# Patient Record
Sex: Male | Born: 1935 | Race: White | Hispanic: No | Marital: Married | Smoking: Former smoker
Health system: Southern US, Community
[De-identification: ages and names within clinical notes are randomized; demographics above are authoritative.]

## PROBLEM LIST (undated history)

## (undated) DIAGNOSIS — M109 Gout, unspecified: Secondary | ICD-10-CM

---

## 1998-11-07 ENCOUNTER — Encounter: Payer: Self-pay | Admitting: Cardiology

## 1998-11-07 ENCOUNTER — Encounter: Payer: Self-pay | Admitting: Emergency Medicine

## 1998-11-07 ENCOUNTER — Inpatient Hospital Stay (HOSPITAL_COMMUNITY): Admission: EM | Admit: 1998-11-07 | Discharge: 1998-11-09 | Payer: Self-pay | Admitting: Emergency Medicine

## 1998-11-08 ENCOUNTER — Encounter: Payer: Self-pay | Admitting: Cardiology

## 1998-11-09 ENCOUNTER — Encounter: Payer: Self-pay | Admitting: Cardiology

## 1999-08-16 ENCOUNTER — Inpatient Hospital Stay (HOSPITAL_COMMUNITY): Admission: AD | Admit: 1999-08-16 | Discharge: 1999-08-19 | Payer: Self-pay | Admitting: Ophthalmology

## 1999-09-01 ENCOUNTER — Ambulatory Visit (HOSPITAL_COMMUNITY): Admission: RE | Admit: 1999-09-01 | Discharge: 1999-09-02 | Payer: Self-pay | Admitting: Ophthalmology

## 1999-09-15 ENCOUNTER — Encounter: Payer: Self-pay | Admitting: Ophthalmology

## 1999-09-15 ENCOUNTER — Observation Stay (HOSPITAL_COMMUNITY): Admission: RE | Admit: 1999-09-15 | Discharge: 1999-09-16 | Payer: Self-pay | Admitting: Ophthalmology

## 2003-12-19 ENCOUNTER — Emergency Department (HOSPITAL_COMMUNITY): Admission: EM | Admit: 2003-12-19 | Discharge: 2003-12-20 | Payer: Self-pay | Admitting: Emergency Medicine

## 2004-01-07 ENCOUNTER — Ambulatory Visit (HOSPITAL_BASED_OUTPATIENT_CLINIC_OR_DEPARTMENT_OTHER): Admission: RE | Admit: 2004-01-07 | Discharge: 2004-01-07 | Payer: Self-pay | Admitting: Orthopedic Surgery

## 2004-10-29 ENCOUNTER — Ambulatory Visit (HOSPITAL_BASED_OUTPATIENT_CLINIC_OR_DEPARTMENT_OTHER): Admission: RE | Admit: 2004-10-29 | Discharge: 2004-10-29 | Payer: Self-pay | Admitting: Orthopedic Surgery

## 2004-10-29 ENCOUNTER — Ambulatory Visit (HOSPITAL_COMMUNITY): Admission: RE | Admit: 2004-10-29 | Discharge: 2004-10-29 | Payer: Self-pay | Admitting: Orthopedic Surgery

## 2005-01-12 IMAGING — CT CT HEAD W/O CM
1 of 2 series · 13 of 30 positions shown, 17 images · non-contrast
Comparison: none

CLINICAL DATA: Headaches.  
 CT HEAD WITHOUT CONTRAST 
 Routine imaging was performed through the brain without IV contrast. 
 There is mild atrophy.  No acute intracranial abnormality.  Specifically, no hemorrhage, hydrocephalus, tumor, vascular lesion, acute infarction, or significant intracranial injury.  Visualized calvarium and paranasal sinuses unremarkable.  Post-operative changes noted in the left globe. 
 IMPRESSION
 No acute intracranial abnormality.  Mild atrophy.

[Series 2: brain · axial · 0.47mm/px · z∈[+146,+266]mm · 13 of 28 slices shown, 17 images]
[im 2/28  brain]
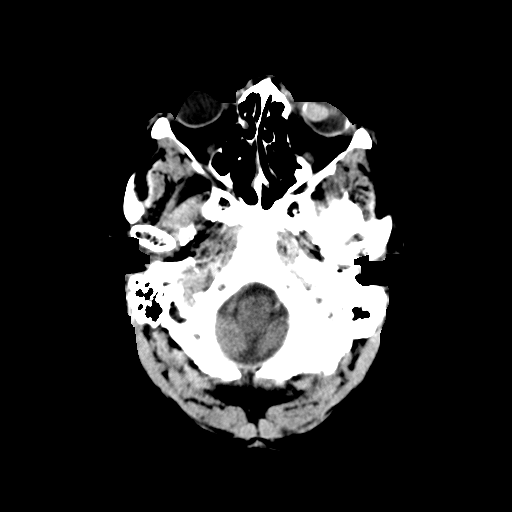
[im 2/28  bone]
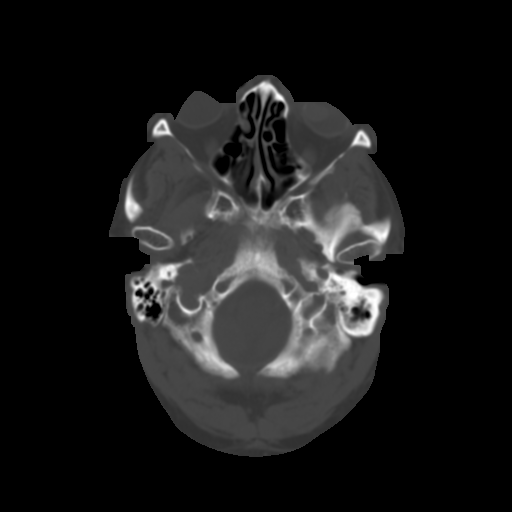
[im 4/28  brain]
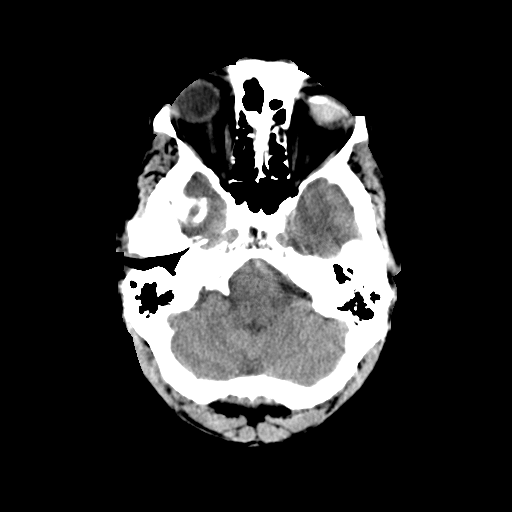
[im 6/28  brain]
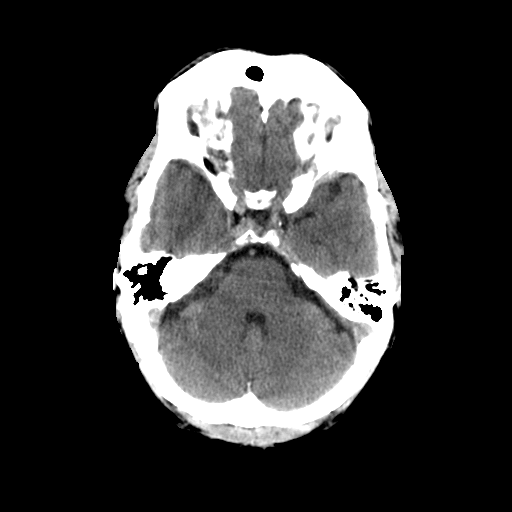
[im 8/28  brain]
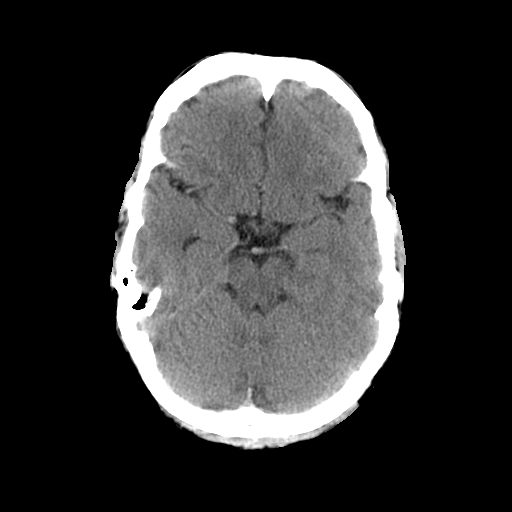
[im 10/28  brain]
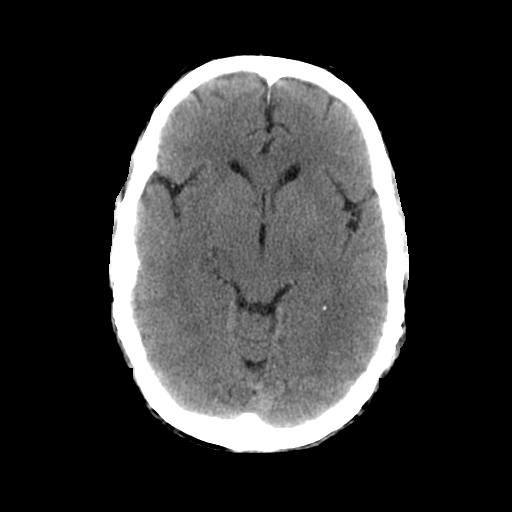
[im 10/28  bone]
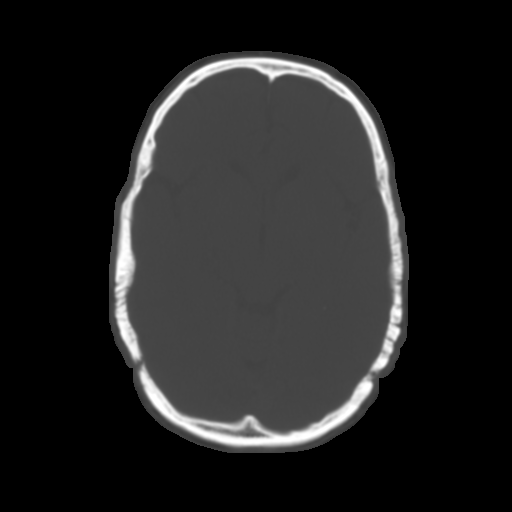
[im 12/28  brain]
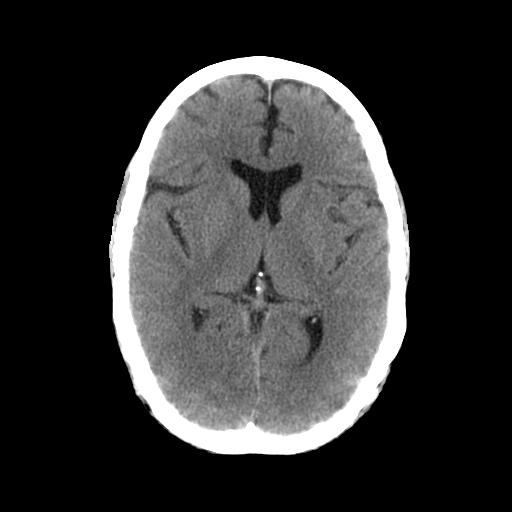
[im 14/28  brain]
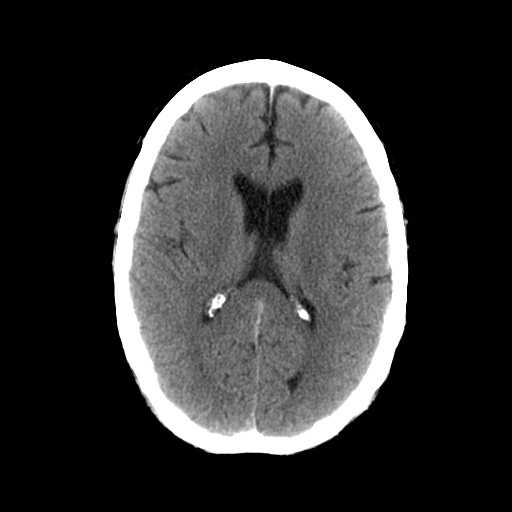
[im 16/28  brain]
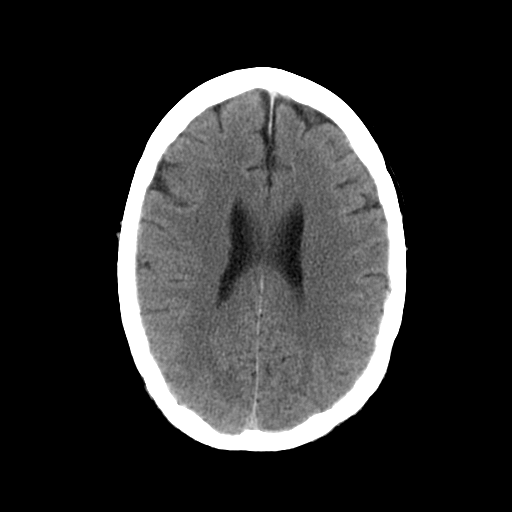
[im 18/28  brain]
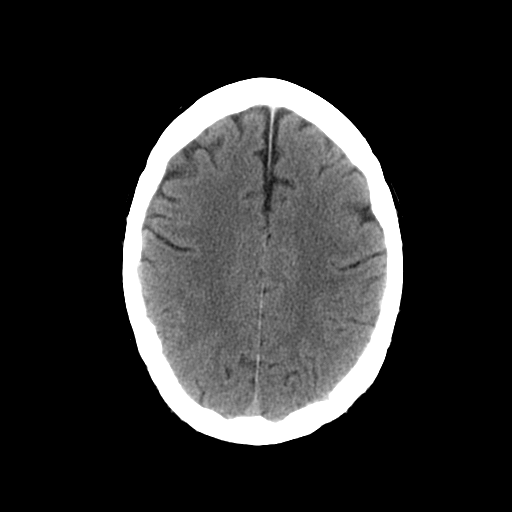
[im 18/28  bone]
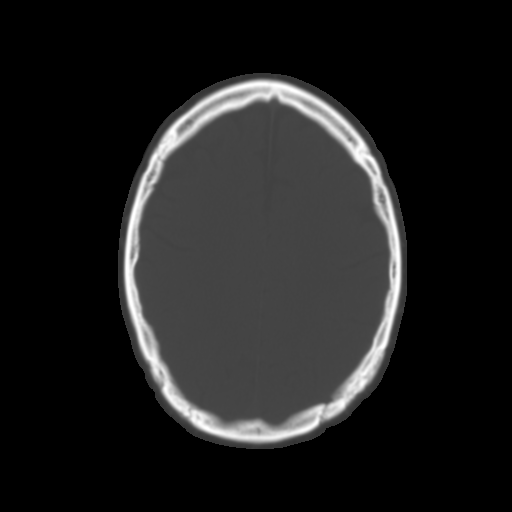
[im 20/28  brain]
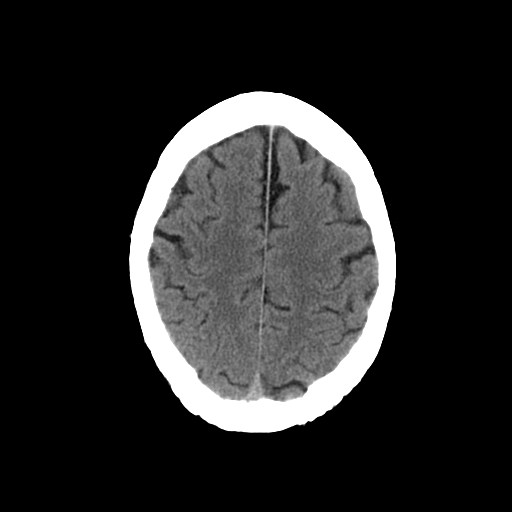
[im 22/28  brain]
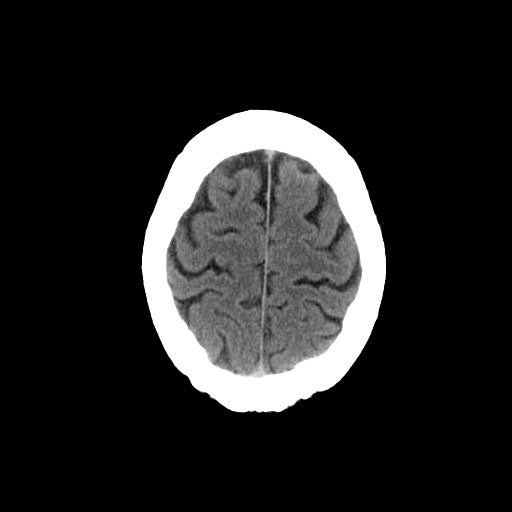
[im 24/28  brain]
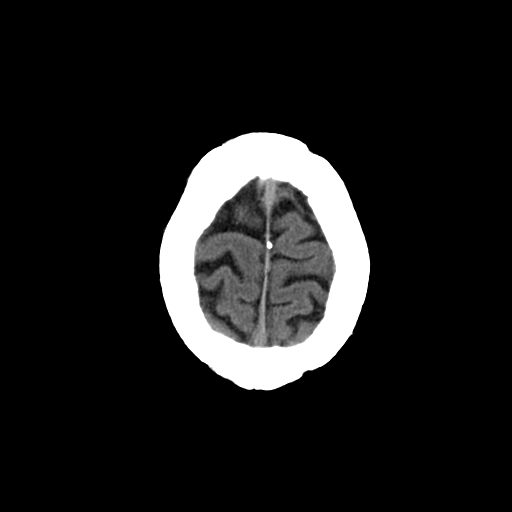
[im 26/28  brain]
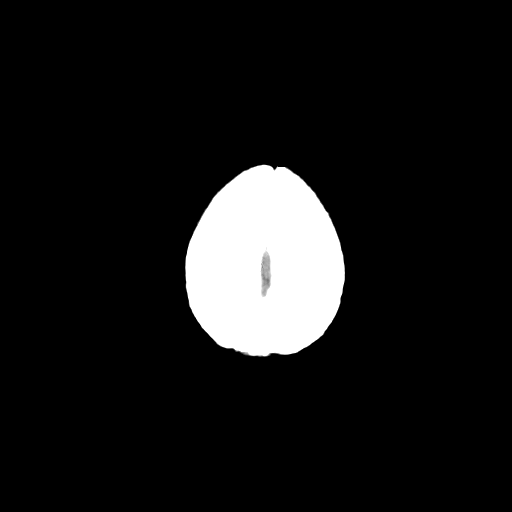
[im 26/28  bone]
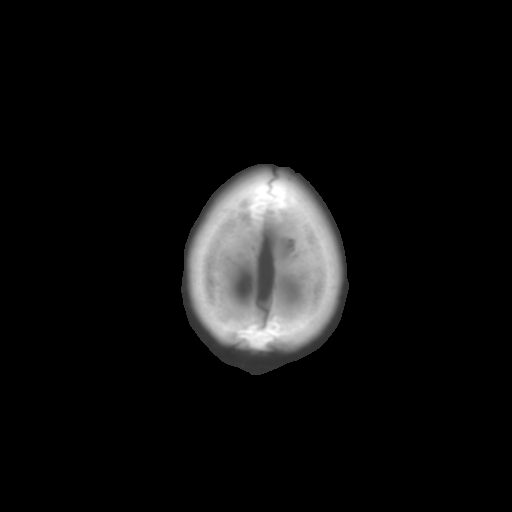

[13 of 30 positions shown; findings below may reference images not displayed]

## 2005-01-19 ENCOUNTER — Ambulatory Visit (HOSPITAL_COMMUNITY): Admission: RE | Admit: 2005-01-19 | Discharge: 2005-01-19 | Payer: Self-pay | Admitting: *Deleted

## 2005-01-19 ENCOUNTER — Ambulatory Visit (HOSPITAL_BASED_OUTPATIENT_CLINIC_OR_DEPARTMENT_OTHER): Admission: RE | Admit: 2005-01-19 | Discharge: 2005-01-19 | Payer: Self-pay | Admitting: *Deleted

## 2008-07-30 ENCOUNTER — Inpatient Hospital Stay (HOSPITAL_COMMUNITY): Admission: RE | Admit: 2008-07-30 | Discharge: 2008-08-03 | Payer: Self-pay | Admitting: Orthopedic Surgery

## 2010-12-30 NOTE — Op Note (Signed)
[name], [name]                ACCOUNT NO.:  [id number]   MEDICAL RECORD NO.:  [id number]          PATIENT TYPE:  INP   LOCATION:  2899                         FACILITY:  [facility]   PHYSICIAN:  Feliberto Gottron. Turner Daniels, M.D.   DATE OF BIRTH:  04-Aug-1936   DATE OF PROCEDURE:  07/30/2008  DATE OF DISCHARGE:                               OPERATIVE REPORT   PREOPERATIVE DIAGNOSIS:  End-stage arthritis, left knee.   POSTOPERATIVE DIAGNOSIS:  End-stage arthritis, left knee.   PROCEDURE:  Left total knee arthroplasty using DePuy Sigma RP components  4 femur, 5 tibia, 10 PS spacer, 38-mm patellar button, double batch of  DePuy HV Cement with 1500 mg Zinacef.  All components cemented.   SURGEON:  Feliberto Gottron. Turner Daniels, MD   FIRST ASSISTANT:  Shirl Harris, PA-C   ANESTHETIC:  General endotracheal.   ESTIMATED BLOOD LOSS:  Minimal.   FLUID REPLACEMENT:  1500 mL of crystalloid.   TOURNIQUET TIME:  1 hour and 30 minutes.   DRAINS:  Two medium Hemovacs and a Foley catheter.   URINE OUTPUT:  300 mL.   INDICATIONS FOR PROCEDURE:  A 75 year old man with end-stage arthritis  of his left knee.  Radiographs show moderate arthritis; however, when he  underwent arthroscopy recently had bare bone arthritic changes to the  trochlea and the patellofemoral joint.  He has failed conservative  treatment with anti-inflammatory medicines, physical therapy, judicious  use of narcotics, cortisone injections, and still has severe disabling  pain.  He desires elective left total knee arthroplasty.  Risks and  benefits of surgery discussed, questions answered.   DESCRIPTION OF PROCEDURE:  The patient identified by armband and  underwent left femoral nerve block anesthetic in the block area at [facility] taken to operating room, where the appropriate anesthetic  monitors were attached and general endotracheal anesthesia induced with  the patient in the supine position.  Foley catheter was inserted and  received 2 g of Ancef IV preoperatively.  Lateral post and foot  positioner applied to the table and the left lower extremity had a  tourniquet applied high to the thigh, then prepped and draped in the  usual sterile fashion from the ankle to the tourniquet.  Standard time-  out procedure performed.  Limb wrapped with an Esmarch bandage,  tourniquet inflated to 350 mmHg, and we began the procedure itself by  making an anterior midline incision about 20 cm in length centered over  the patella starting 1 handbreadth above the patella going over the  patella, 1 cm medial to, and 3 cm distal to the tibial tubercle.  Small  bleeders in the skin and subcutaneous tissue were identified and  cauterized.  Transverse retinaculum incised allowing medial parapatellar  arthrotomy.  The patella was everted.  Prepatellar fat pad resected.  Superficial medial collateral ligament elevated off the proximal tibia  from anterior to posterior leaving it intact distally about 6 cm down  the tibial metaphysis.  The knee was then hyperflexed allowing removal  of the anterior half of the menisci.  We actually got the lateral  meniscus out at this point as well.  Cruciate ligaments were resected.  Posteromedial Z retractor was placed, a Michaela retractor through the  notch, and lateral Homan retractor.  With the knee hyperflexed, this  exposed proximal tibia, which was then entered with a DePuy step drill  followed by intramedullary rod and a 2 degrees posterior slope cutting  guide allowing resection of about 8 to 9 mm of bone medially and  laterally using a power oscillating saw.  We then entered the distal  femur 2 mm anterior to the PCL origin followed by the intramedullary rod  and a 5-degree left distal femoral cutting guide set at 11 mm, pinned  along the epicondylar axis, distal femoral cut accomplished, we sized  for #4 femoral component, pinned the cutting guide in 3 degrees of  external rotation and  performed an anterior-posterior chamfer cuts  without difficulty followed by the Sigma RP box cut.  The patella cell  was measured at 24 mm.  We thought a 38 button would be the best fit,  set the cutting guide at 15, and removed the posterior 9-10 mm of the  patella, sized for 38-mm button and drilled.  The knee was then  hyperflexed once again exposing the proximal tibia, which was sized for  #5 tibial baseplate, which was pinned into place followed by the  smokestack and the conical reamer.  The Delta fin keel punch was then  inserted, a 5 left trial femoral component was then hammered on the  femur and the lugs drilled.  We then placed a 10-mm trial spacer and  took the knee through a range of motion from full extension to flexion  of 140 degrees with good tracking of the patella and no ligamentous  laxity.  The trial components were removed and all bony surfaces were  water picked, clean, and dried with suction and sponges, we also removed  any remnants of the menisci posteriorly at this point.  A double batch  of the DePuy HV Cement with 1500 mg of Zinacef was then mixed and  applied to all bony metallic mating surfaces except for the posterior  condyles of the femur itself.  With the knee hyperflexed, we then  hammered into place a #5 tibial baseplate and removed excess cement.  The 4 left femoral component and removed excess cement, inserted a 10-mm  Sigma RP spacer, brought the knee up to full extension, and applied  compression while the cement cured.  The patellar button was then  squeezed into place and held with a clamp and excess cement also  removed.  The wound was then water picked and cleaned one more time.  Medium Hemovacs were inserted from a lateral approach and after the  cement had cured, we then clamped the patella, took the knee to one more  range of motion to confirm good tracking and also to make sure there  were no bits of cement left over.  Parapatellar  arthrotomy was then  closed with running #1 Vicryl suture.  The subcutaneous tissue with 0  and 2-0 undyed Vicryl suture and the skin with skin staples.  A dressing  of Xeroform, 4 x 4 dressing, sponges, Webril, and Ace wrap applied.  Tourniquet let down.  The patient awakened and taken to the recovery  room without difficulty.      Feliberto Gottron. Turner Daniels, M.D.  Electronically Signed     FJR/MEDQ  D:  07/30/2008  T:  07/30/2008  Job:  [id number]

## 2011-01-02 NOTE — H&P (Signed)
[place]. [facility]  Patient:    [name]                          MRN: [id number] Adm. Date:  [id number] Attending:  Ernesto Rutherford CC:         [name], M.D., Ophthalmology, [place], [place]             Ernesto Rutherford, M.D.                         History and Physical  HISTORY:  The patient is a 75 year old man who is two days status post cataract  extraction, with intraocular lens placed in the left eye in a noncomplicated fashion.  The first postoperative day with Dr. Betsy Pries noted mild anterior chamber reaction.  The patient developed, last night, some 36 hours after the procedure, severe pain.  Found today to have profound vision loss in the left eye, with hypopyon and fibrin clot over the pupil of the left eye with lack of perception and vision in the left eye.  The patient is believed to have endophthalmitis and referred for consultation, evaluation, and institution of therapy for the left ye.  On examination, the patient is found to have 1 mm hypopyon with fibrin clot preventing visualization of the posterior pole of the left eye, 2+ chemosis and  significant aching pain is present in the left eye.  PAST OCULAR HISTORY:  Noncontributory with the exception of mild cataract in the right eye.  Past surgery history for the eye is noncontributory.  PAST SURGICAL HISTORY:  Notable for partial gastrectomy for gastric ulcers, as ell as appendectomy.  ALLERGIES:  None.  CURRENT MEDICATIONS:  None.  SOCIAL HISTORY:  The patient is employed full time for [employer].  Does not smoke, and does not drink alcohol.  PHYSICAL EXAMINATION:  VITAL SIGNS:  As per nurses intake.  CHEST:  Clear to auscultation.  CARDIOVASCULAR:  Regular rate and rhythm without murmur.  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  PLAN:  Admission  to the hospital, diagnostic vitreous tap, vitrectomy combined ith installation of intravitreous antibiotics and attempt to salvage the globe. The risks and benefits of the surgery, as well as doing nothing were described at length with the patient and his family (sons).  The patient understood these risks and wishes to proceed with surgical intervention. DD:  08/15/99 TD:  08/16/99 Job: [id number]

## 2011-01-02 NOTE — Op Note (Signed)
[place]. [facility]  Patient:    [name]                          MRN: [id number] Proc. Date: 08/17/99 Adm. Date:  [id number] Attending:  Ernesto Rutherford CC:         [name], M.D., [place]                           Operative Report  PREOPERATIVE DIAGNOSIS:  History of endophthalmitis and treatment of this condition with vitrectomy and insulation of intravitreal antibiotics on August 15, 1999. The patient had a quiet postoperative day #1 with moderate pain, but improvement of his intraocular inclination and on day #2, I noted recurrent hypopion with concern for persistence of Streptococcal pneumoniae infection in the vitreous and anterior chamber of the left eye.  This is therefore a diagnostic vitreous tap and surgical intervention to deliver definitive intraocular antibiotics for the second time o the left eye to hopefully clear the infection.  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  PROCEDURE:  Diagnostic vitreous tap of the left eye.  Instillation of intravitreal antibiotics.  #1 - Vancomycin 1 mg/0.1 cc.  #2 - Ancef or Cefazolin 2.25 mg/0.1 cc volume 0.1 cc into the vitreous.  SURGEON:  Ernesto Rutherford, M.D.  ANESTHESIA:  Local retrobulbar with monitored anesthesia control.  INDICATIONS:  As described above.  DESCRIPTION OF PROCEDURE:  The patient understood the risks of treatment versus no treatment and wishes to proceed with intervention.  He understands that the condition could continue despite our interventions.  After appropriate signed consent was obtained, the patient was taken to the operating room.  In the operating room, appropriate monitoring followed by mild sedation with 0.5% Marcaine 5 cc injected retrobulbar followed by additional 5 cc laterally in fashion modified Darel Hong.  Left  periocular region was then sterilely prepped and draped in the usual ophthalmic fashion with Betadine topically applied.  This was washed from the conjunctival surface a few minutes later.  At this time, a diagnostic vitreous tap was performed using a 38 needle via the  pars plana.  Approximately 0.5 cc was aspirated of a clear color, but nonetheless cloudy fluid with apparent white vessel debris removed.  This was plated directly onto to blood agar plates, chocolatey agar.  Additional anterior chamber tap was then performed to remove anterior chamber debris.  Approximately 0.1 cc removed in this fashion.  Thereafter Vancomycin volume described above was then injected via the pars plana as well as followed by the Cefazolin into the pars plana for a volume also of 0.1 cc additionally. The remainder of these two medications were then injected subconjunctively. Intraocular pressure was assessed and found to be adequate.  The anterior chamber specimen was also plated directly in the operating room to sheet band chocolatey agar.  A sterile patch and Fox shield applied.  The patient tolerated the procedure very well without discomfort or complications.  The patient was then taken to the recovery room in good and stable condition.  Of note, the patient will be continued on high doses of IV Ancef to deliver effective antibiotic killing to the blood ocular barrier.  This will be followed in 8 to 10 hours by the delivery of Decadron to decrease the inflammatory response. DD:  08/18/99 TD:  08/18/99 Job: [id number]

## 2011-01-02 NOTE — Op Note (Signed)
[place]. [facility]  Patient:    [name]                          MRN: [id number] Proc. Date: 09/15/99 Adm. Date:  [id number] Disc. Date: [id number] Attending:  Ernesto Rutherford                           Operative Report  PREOPERATIVE DIAGNOSES: 1. 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 2. 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 3. Dense recurrent vitreitis, left eye. 4. Corneal edema and corneal opacification.  POSTOPERATIVE DIAGNOSES: 1. 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 2. 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 3. Dense recurrent vitreitis, left eye. 4. Corneal edema and corneal opacification. 5. Corneal clearing with anterior chamber flattening found at the time of surgery.  PROCEDURE: 1. Posterior vitrectomy with revision of retinal detachment repair by placement of    scleral buckle, injection of vitreous substitute -- silicone oil -- 5000    centistokes, endodiathermy for retinopathy and inferior peripheral iridectomy    and removal of synclitic membrane in the left eye. 2. Extensive membrane peel formation for recurrent membranes over the retinal    surface. 3. Injection of temporary tissue plasminogen activator to remove and to release    some of the dense fibrin membranes found over the retina. 4. Planned temporary keratoprosthesis with penetrating keratoplasty, which was ot    necessary because the cornea, in fact, was clear at the time of surgery.  SURGEON:  Ernesto Rutherford, M.D.  ANESTHESIA:  General endotracheal anesthesia.  INDICATION FOR PROCEDURE:  Patient is a rather unfortunate man, a 75 year old, ho has had endophthalmitis in the left eye, treated previously with a vitrectomy, injection of intraocular antibiotics on two separate occasions, followed by  one  week later, another injection of antibiotics, with vitrectomy and removal of recurrent vitreous and anterior chamber membranes with removal of intraocular lens because of possible sequestration of the endophthalmitis in the capsular bag. e has now developed a recurrent fibrin formation, recurrent retinal detachment and this is an attempt to reattach the retina to provide salvaging of the globe. Patient had been consented and had given informed consent for consideration of penetrating keratoplasty with temporary keratoprosthesis to allow repair of the  retinal detachment, since the cornea had been cloudy in the previous days leading up to surgery; however, at the time of surgery, under the microscope, it was found that while the anterior chamber was shallow, nonetheless the cornea had cleared and no temporary keratoprosthesis or penetrating keratoplasty would be necessary.  Patient has had profound and persistent vision loss after therapy for endophthalmitis. DD:  09/17/99 TD:  09/17/99 Job: [id number]

## 2011-01-02 NOTE — Op Note (Signed)
[place]. [facility]  Patient:    [name]                          MRN: [id number] Proc. Date: 08/15/99 Adm. Date:  [id number] Attending:  Ernesto Rutherford CC:         [name], M.D. - [facility], [place], [place]                           Operative Report  PREOPERATIVE DIAGNOSIS: 1. Endophthalmitis suspected, left eye. 2. Dense vitritis, uveitis, left eye. 3. Dense pupillary fibrin membrane, left eye. 4. Limbal wound dehiscence of the left eye.  POSTOPERATIVE DIAGNOSIS: 1. Endophthalmitis suspected, left eye. 2. Dense vitritis, euvitis, left eye. 3. Dense pupillary fibrin membrane, left eye. 4. Wound dehiscence of the left eye.  PROCEDURE: 1. Posterior vitrectomy with membrane peel, left eye. 2. Surgical posterior capsulotomy, left eye. 3. Removal of pupillary fibrin membrane with forceps, left eye. 4. Installation of antibiotics - ceftazidime 2.25 mg/0.1 cc volume. 5. Installation of vancomycin 1 mg/0.1 cc volume. 6. Repair of wound dehiscence - limbal.  INDICATIONS:  The patient is a 75 year old man, two days status post cataract extraction with intraocular lens placement in the left eye, who has had profound vision loss of the left eye and pain developing over the last 12 hours, after a  recent cataract extraction with an intraocular lens placement in the left eye, reportedly uncomplicated.  The patient noted the onset of pain last night, and as found today to have dense hypopyon, pupillary fibrin membrane, by Dr. [name], and was sent for a consultation and evaluation, and the institution of therapy, to the hospital emergency room tonight.  The patient was found to have  dense pupillary fibrin membrane, light perception, and vision and hypopyon suspected, highly suspicious of an endophthalmitis.  This is an attempt at diagnostic vitreous tap, as well as therapeutic vitrectomy and debulking of the  vitreous  opacification and infected material.  DESCRIPTION OF PROCEDURE:  The patient had the risks and benefits of the surgical intervention described, with his sons present.  He understands the need for surgical intervention.  He gives his consent for anesthesia as well as surgical  repair, understanding the risks, including hemorrhage, infection, scarring, need for further surgery, no change in vision, loss of vision, and progressive disease, despite intervention.  An appropriate signed consent was obtained.  The patient was taken to the operating room.  In the operating room general endotracheal anesthesia was instituted without difficulty.  The left periocular region was sterilely prepped and draped in the  usual ophthalmic fashion.  A lid speculum was applied.  The conjunctival peritomy was fashioned superiorly.  The decision was made to use a Lewicky anterior chamber maintainer through an inferotemporal limbal wound, to keep the anterior chamber  segment deep and formed and clear.  A second paracentesis wound was then fashioned superotemporally and this allowed for passage of intraocular forceps to the pupillary fibrin membrane, which was rather dense and adherent to the iris pigmented epithelium.  Nonetheless, the pigment and the pupillary fibrin membrane could be removed in this fashion, which allowed for excellent excursion of the pupillary fibers and further dilatation was possible.  The dilation occurred pharmocologically from the previous dilating drops, and was immediate.  At this time limbal incisions were made 3.5 mm posterior limbus superiorly. This was all done with the  Biom microscope.  The Biom attachments were then swung into position.  A core vitrectomy was then begun with the vitrectomy cessation immediately behind the posterior capsule.  The posterior capsule was surgically  opened, to allow for free passage of the anterior chamber fluid into  the posterior chamber.  The anterior vitreous skirt was then engaged, and dense opacified white loculated cottage cheese-type inflammatory material was identified and removed.  The core vitrectomy was then completed.  The posterior vitreous was attached to the optic nerve; however, was mobile, and the vitreous skirt was entered infranasally. The vitreous skirt was then elevated rather clearly and easily and spontaneously, and allowed for a nice removal of dense white opacified posterior hyaloid face.  Underneath this was preretinal white cells in a layered fashion.  These were aspirated with passive extrusion.  Notable findings were significant intraretinal hemorrhage.  The posterior vitreous attachment at the optic nerve was amputated, but not pulled free, because of the dense inflammatory nature of the debris, and concern that this might effect the optic nerve perfusion.  The vitreous skirt was then trimmed 360 degrees.  Excellent debridement of the vitreous cavity was completed in this fashion.  No retinal holes or tears were noted.  The retina was completely attached.  At this time the instruments were removed from the eye. he superior sclerotomies were then closed with #7-0 Vicryl sutures.  A limbal wound dehiscence at 12 oclock with some purulent material present, was cleansed and then closed with interrupted #10-0 nylon suture.  At this time the wound was found to be secure superiorly.  The conjunctiva was then closed with #7-0 Vicryl suture in n interrupted fashion.  The antibiotics were then instilled into the vitreous cavity under direct observation, ceftazidime and vancomycin in the concentrations and volumes as described above.  The remainder of the vancomycin and ceftazidime were then injected subconjunctivally.  Intraocular pressure was assessed and found to be adequate, less than 20.0 mm.  The notable findings also must be made of the anterior chamber fibrin  membrane, plated directed onto blood auger and chocolate T-auger plates, as was the vitreous aspirate put on separate culture plates.  A  vitreous aspirate was also sent for fungal and anaerobic cultures specimen. The vitreous washing cassette was then sent as well, for Cytospin and Grams stain  tonight, as well as culture in a routine fashion.  The patient was awakened from anesthesia.  The patient was taken to the recovery room.  In the operating room intravenous ceftazidime 1 g was given, followed thereafter by the beginning of vancomycin 1 g IV piggyback.  The patient tolerated the procedure well without complications. DD:  08/15/99 TD:  08/17/99 Job: [id number]

## 2011-01-02 NOTE — Discharge Summary (Signed)
[place]. [facility]  Patient:    [name]                          MRN: [id number] Adm. Date:  [id number] Disc. Date: [id number] Attending:  Ernesto Rutherford                           Discharge Summary  ADMISSION DIAGNOSES: 1. ______ of the left eye. 2. Profound visual loss of the left eye.  DISCHARGE DIAGNOSES: 1. ______ of the left eye. 2. Profound visual loss of the left eye. 3. Streptococcus pneumoniae ______ of the left eye.  ADDITIONAL DIAGNOSES: 1. Severe ______ with profound visual loss of the left eye. 2. Persistent vitritis of the left eye.  PROCEDURES: 1. On August 15, 1999, vitrectomy with diagnostic and therapeutic vitrectomy f    the left eye. 2. Injection of intravitreal antibiotics of the left eye. 3. Diagnostic vitreous ______ of the left eye with cultures.  OTHER PROCEDURES:  On August 17, 1999, repeat injection of intravitreal antibiotics in an attempt to further clear his vitreous cavity of infection and  inflammation on the basis of recurrent hypopyon two days after his initial aggressive intervention.  HOSPITAL COURSE:  The patient was started on intravenous ceftazidime and vancomycin on the initial date of operation, August 15, 1999, which was continued until August 17, 1999, at which time ceftazidime was discontinued, and only vancomycin was given.  Starting on August 17, 1999, IV Ancef was given for this relatively sensitive organism.  Hospital course was notable for recurrent hypopyon developing two days after surgery on August 17, 1999, necessitating repeat diagnostic and therapeutic vitreous ______ to deliver intravitreal antibiotics consisting of vancomycin and Ancef.  Two days after that on August 19, 1999, the patient had less pain, was showing o growth on the repeat cultures from August 17, 1999, and was, thus, discharged  home in good and stable condition, seemingly improving on a topical  regimen to include atropine b.i.d., Polytrim t.i.d., Ocuflox q.i.d. and Pred Forte t.i.d. or the left eye.  FOLLOW-UP:  The patient will be seen in the office in three to four days. DD:  10/03/99 TD:  10/04/99 Job: [id number]

## 2011-01-02 NOTE — Op Note (Signed)
[place]. [facility]  Patient:    [name]                          MRN: [id number] Proc. Date: 09/01/99 Adm. Date:  [id number] Attending:  Ernesto Rutherford                           Operative Report  PREOPERATIVE DIAGNOSIS: 1. Dense vitritis, with uveitis of the left eye. 2. Suspected endophthalmitis.  POSTOPERATIVE DIAGNOSIS: 1. Dense vitritis, with uveitis of the left eye. 2. Suspected endophthalmitis. 3. Possibly sequestered endophthalmitis in the capsular bag of the left around    the intraocular lens from previous cataract surgery.  PROCEDURE: 1. Posterior vitrectomy with membrane peel, left eye; for dense vitritis and    presclerotic membrane formation behind the intraocular lens and in the anterior    vitreous cavity. 2. Removal of intraocular lens. 3. Surgical posterior capsulectomy. 4. Injection of pharmacologic agent, Ancef 2.25 mg (volume 0.1 cc) 5. Injection of pharmacologic agent, vancomycin 1 mg (volume 0.1 cc) into left ye.  SURGEON:  Ernesto Rutherford, M.D.  ANESTHESIA:  General endotracheal anesthesia.  INDICATIONS FOR PROCEDURE:  The patient is a 75 year old man with known streptococcal pneumoniae, endophthalmitis of the left eye after previous cataract surgery with intraocular lens placement; now status post vitrectomy with membrane peel and intraocular instillation of vancomycin and ceftazidime, approximately 10-14 days prior to this admission.  Initial surgery disclosed a dense, purulent debris at the vitreous cavity and the anterior chamber.  Three days later reaccumulation of the hypopyon prompted reinjection of intraocular antibiotics f Ancef and vancomycin for further clearing of the vitreous cavity.  Over the ensuing 7-9 days the patient has had moderate discomfort, which has been largely relieved with cycoplegic agents, no recurrence of the hypopyon. However, he has developed recurrent fibrin membrane over the  pupil.  He also has dense retrolental fibrinous material.  Ultrasound has disclosed no evidence of retinal detachment, except dense vitreous debris is present.  Today as an attempt to remove the fibrinous debris so as to prevent complications from inflammatory effect on the retina, as well as to further clear out the anterior chamber view.  Furthermore, the possibility exists of there remotely being sequestered endophthalmitis.  He understands the risks of anesthesia, including the rare occurrence of death, but mostly to the eye, including hemorrhage, infection, scarring, need for further surgery, no change in vision, loss of vision, and even progressive disease.  He  understands these risks and gives his consent for anesthesia as well as surgery.  DESCRIPTION OF PROCEDURE:  Appropriate signed consent was obtained.  The patient was taken to the operating room.  In the operating room general endotracheal anesthesia ensued without difficulty.  The patient preoperatively had wished to be put out completely, and did not want to have (or even attempt) local retrobulbar ______ anesthesia.  At this time the intraocular lens was sterilely draped using the usual ophthalmic fashion.  Lid speculum was applied.  Conjunctival peritomy  fashioned supranasally, superotemporally and anterotemporally.  Initially a Lawscky interior chamber maintainer was placed into the eye to allow for clarity of the  anterior chamber view.  Through a paracentesis incision, Dorc forceps were then  used to engage the pre______ fibroid membrane.  There appeared to be some vascular growth overlying the edge of this membrane, and this induced a small amount of bleeding into  the anterior chamber.  Viscoat was then used to clarify and clear the visual access.  At this time hemostasis was spontaneous.  Later fibrin clots were directly removed with forceps and remainder of the blood was irrigated free  of he anterior chamber -- which remained clear through the remainder of the procedure. This allowed placement of the posterior infusion cannula.  This was placed 3.5 m post-limbus and ______ ______ .  Placement of vitreous cavity verified visually  prior to the institution of the infusion.  Superior sclerotomies were then fashioned.  Wild microscope placed in position with Biom attachment.  Dense, wide, mucal, purulent fibrinous debris was noted immediately posterior to the lens. Scleral depression disclosed significant reaccumulation of mucopurulent, fibrinous debris.  There appeared to be a sequestrum of inflammation in the capsular bag.  For this reason it was decided to remove the intraocular lens.  The limbal wounds superiorly were then opened with MVR blade through the previous 10.0 nylon sutures.  Under Viscoat the intraocular lens was prolapsed gently into the anterior chamber and removed with direct forceps extraction.  The anterior chamber and limbal wound was then closed with 3.0 nylon interrupted sutures.  At this time ______ pressure was then used to remove the dense debris of the anterior vitreous base.  Capsular fragments were engaged and removed, although ome were adherent to the iris as to not be removable directly.  ______ pressure was  used at 360 degrees to trim the vitreous base and its inflammatory debris; especially the posterior pole confirmed the retina while it was attached.  It did have some inflammatory debris overlying the surface of it, and that the retinal  vein was engorged with perivascular blood staining.  There was, however, retinal detachment.  Macular perfusion through the hazy corneous appeared to be intact.  At this time decision was made to close the superior sclerotomies with 7.0 Vicryl suture.  The ______ ______ closed with 7.0 Vicryl suture.  Injections of vancomycin 1 mg in a volume of 0.1 cc, and Ancef 2.25 mg in a volume of 0.1  cc would injected through vitreous cavity.  Intraocular pressure was assessed and found to be adequate.  At this time the concert was also closed with 7.0 Vicryl suture.  Remnants of the vancomycin and Ancef were then injected into the subconjunctival space inferiorly.  Small amount of Decadron was also injected superiorly to the subconjunctival space.   The patient tolerated the procedure well without complications. DD:  09/01/99 TD:  09/01/99 Job: [id number]

## 2011-01-02 NOTE — Op Note (Signed)
NAMELLOYDE, LUDLAM                ACCOUNT NO.:  [id number]   MEDICAL RECORD NO.:  [id number]          PATIENT TYPE:  AMB   LOCATION:  DSC                          FACILITY:  [facility]   PHYSICIAN:  Feliberto Gottron. Turner Daniels, M.D.   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   PROCEDURE:  Left knee arthroscopic debridement of grade 4 chondromalacia  with flap tear to the distal aspect and anterior aspect of the medial  femoral condyle.   SURGEON:  Feliberto Gottron. Turner Daniels, M.D.   ASSISTANT:  __________ Su Hilt, P.A.-C.   ANESTHETIC:  General LMA.   ESTIMATED BLOOD LOSS:  Minimal.   FLUID REPLACEMENT:  800 mL crystalloid.   DRAINS PLACED:  None.   TOURNIQUET TIME:  None.   INDICATIONS FOR PROCEDURE:  A 75 year old man with known chondromalacia flap  tears of the left knee underwent arthroscopic debridement of same a few  months ago and now has recurrent pain, catching and popping; has failed  conservative treatment and desires redo arthroscopy. Risks, benefits of  surgery well understood by the patient.   DESCRIPTION OF PROCEDURE:  The patient identified by armband, taken the  operating room, at [facility]. Appropriate site monitors were  attached and general LMA anesthesia induced with the patient in he supine  position. Lateral post was applied to the table; and the left lower  extremity prepped and draped in the usual sterile fashion from the ankle to  the midthigh. Using a #11 blade standard inferomedial and inferolateral  peripatellar portals were made allowing reduction the arthroscope through  the inferolateral portal and the outflow through the inferomedial portal.   Diagnostic arthroscopy revealed grade 4 chondromalacia to the distal and  anterior  aspects of the medial femoral condyle with recurrent flap tears. It  was debrided back to a stable margin using a 3.5 gator-sucker shaver. Grade  2-3 chondromalacia of the patella was identified and lightly debrided.  The  ACL and the PCL were intact on the medial side.  There were no meniscal or  articular cartilage tears to the tibia.  The lateral compartment was in good  condition. The gutters were cleared medial and laterally; and the scope was  taken to the lateral side of the PCL clearing the posterior compartment.  The knee was thoroughly irrigated out with normal saline solution. No  significant loose bodies were encountered.   At this point the arthroscopic instruments were removed and a dressing of  Xeroform 4 x 4 dressing, sponges, Webril and an Ace wrap applied. The  patient was then awakened and taken to recovery room without difficulty.       FJR/MEDQ  D:  01/19/2005  T:  01/19/2005  Job:  [id number]

## 2011-01-02 NOTE — Op Note (Signed)
[name], [name]                ACCOUNT NO.:  [id number]   MEDICAL RECORD NO.:  [id number]          PATIENT TYPE:  AMB   LOCATION:  DSC                          FACILITY:  [facility]   PHYSICIAN:  Feliberto Gottron. Turner Daniels, M.D.   DATE OF BIRTH:  [birth year]   DATE OF PROCEDURE:  10/29/2004  DATE OF DISCHARGE:                                 OPERATIVE REPORT   PREOPERATIVE DIAGNOSIS:  Left knee cartilaginous loose bodies, possible  medial meniscal tear.   POSTOPERATIVE DIAGNOSIS:  Left knee cartilaginous loose bodies, possible  medial meniscal tear.   PROCEDURE:  Left knee arthroscopic partial medial and lateral meniscectomies  with removal of loose bodies, debridement of chondromalacia of medial  femoral condyle, focal grade IV; patella apex, focal grade IV   SURGEON:  Feliberto Gottron. Turner Daniels, M.D.   FIRST ASSISTANT:  None.   ANESTHESIA:  General LMA.   ESTIMATED BLOOD LOSS:  Minimal.   FLUIDS REPLACEMENT:  800 mL Crystalloid.   DRAINS PLACED:  None.   TOURNIQUET TIME:  None.   INDICATIONS FOR PROCEDURE:  A 75 year old man who underwent right knee  partial meniscectomy by me about a year ago, has similar catching, popping,  and pain in the left knee.  Has put up with it for a number of months.  Now  desires arthroscopic evaluation and treatment of the left knee.  He is well  aware of the risks and benefits of surgery.   DESCRIPTION OF PROCEDURE:  The patient was identified by arm band and taken  to the operating room at [facility].  Appropriate hemodynamic  monitors were attached, and general LMA anesthesia induced with patient in  supine position.  Lateral posts were applied to the table.  Left lower  extremity prepped and draped in usual sterile fashion from the ankle to the  mid thigh.  Then, using a #11 blade, standard anteromedial and anterolateral  peripatellar portals were made.  Immediately upon entering the knee joint,  we encountered cartilaginous loose bodies which  were taken through the  outflow using the medial portal or a 3.5 [employer] shaver.  Apex of  the patella had focal grade IV chondromalacia with flap tears that was  debrided back to a stable margin over an 8 mm diameter area.  Medial femoral  condyle, about a 12 mm area with some cartilage still in the center,  required debridement as well in this 75 year old gentleman.  Moving the  medial compartment,  posterior horn of the medial meniscus was noted to be  torn and debrided back to a stable margin with a Gator Sucker shaver an  straight borders.  The ACL and PCL were intact on the lateral side.  Some  degenerative tearing of the lateral meniscus was noted and debrided, maybe  5% of the cartilage.  The gutters were cleared medially and laterally of  some small cartilaginous loose bodies that were hiding out there, taken  through the outflow.  The knee was then  irrigated out with normal saline solution via the arthroscope.  The  arthroscopic instruments were  removed, and a dressing with Xeroform, 4 x 4  dressing, sponges, Webril, and an Ace wrap applied.  The patient was then  awakened and taken to the recovery room without difficulty.      FJR/MEDQ  D:  10/29/2004  T:  10/29/2004  Job:  [id number]

## 2011-01-02 NOTE — Op Note (Signed)
NAME:  [name], [name]                            ACCOUNT NO.:  [id number]   MEDICAL RECORD NO.:  [id number]                   PATIENT TYPE:  AMB   LOCATION:  DSC                                  FACILITY:  [facility]   PHYSICIAN:  Feliberto Gottron. Turner Daniels, M.D.                DATE OF BIRTH:  Mar 28, 1936   DATE OF PROCEDURE:  01/07/2004  DATE OF DISCHARGE:                                 OPERATIVE REPORT   PREOPERATIVE DIAGNOSIS:  Right knee medial meniscal tear.   POSTOPERATIVE DIAGNOSIS:  Right knee medial meniscal tear, chondromalacia of  the trochlea, grade 3 flap tear.   PROCEDURE:  Right knee arthroscopic partial medial meniscectomy and  debridement of chondromalacia of the trochlea.   SURGEON:  Feliberto Gottron. Turner Daniels, M.D.   FIRST ASSISTANT:  Erskine Squibb B. Su Hilt, P.A.-C.   ANESTHESIA:  General LMA.   ESTIMATED BLOOD LOSS:  Minimal.   FLUIDS REPLACED:  800 mL crystalloid.   DRAINS:  None.   TOURNIQUET TIME:  None.   INDICATIONS FOR PROCEDURE:  The patient is a 75 year old gentleman with  anteromedial right knee pain, MRI scan is consistent with a medial meniscal  tear.  He has failed conservative treatment and desires elective  arthroscopic evaluation and treatment of his right knee.   DESCRIPTION OF PROCEDURE:  The patient was identified by her arm band and  taken to the operating room at [facility].  Appropriate  anesthetic monitors were attached and general LMA anesthesia was induced  with the patient in the supine position.  Lateral post was applied to the  table.  The right lower extremity was prepped and draped in the usual  sterile fashion from the ankle to the mid thigh.  We began the procedure by  making standard inferomedial and inferolateral peripatellar portals allowing  introduction of the arthroscope through the inferolateral portal and the  outflow through the inferomedial portal.  Diagnostic arthroscopy revealed  the patella and suprapatellar pouch to be in good  condition.  There was a  small flap tear in the trochlea which was debrided back to a stable margin  with a 3.5 Gator sucker shaver.  Moving to the medial compartment, we  identified a complex tear of the posterior horn of the medial meniscus with  a horizontal cleavage tear, as well, and this was debrided back to the  stable margin with the straight biters and the 3.5 Gator sucker shaver.  The  articular cartilage of the medial compartment was in good condition as was  the lateral compartment.  The ACL and the PCL were intact.  The gutters were  clear.  The scope was taken to the medial side of the PCL clearing the  posterior compartment, as well.  The  knee was then irrigated out with normal saline solution.  The arthroscopic  instruments were removed and a dressing of Xeroform, 4 by 4 dressing  sponges, Webril and an Ace wrap were applied.  The patient was awakened and  taken to the recovery room without difficulty.                                               Feliberto Gottron. Turner Daniels, M.D.    Ovid Curd  D:  01/07/2004  T:  01/07/2004  Job:  [id number]

## 2011-01-02 NOTE — Discharge Summary (Signed)
[name], [name] Garza                ACCOUNT NO.:  [id number]   MEDICAL RECORD NO.:  [id number]          PATIENT TYPE:  INP   LOCATION:  5011                         FACILITY:  [facility]   PHYSICIAN:  [name] Garza, M.D.   DATE OF BIRTH:  1936/03/20   DATE OF ADMISSION:  07/30/2008  DATE OF DISCHARGE:  08/03/2008                               DISCHARGE SUMMARY   CHIEF COMPLAINT:  Left knee pain.   HISTORY OF PRESENT ILLNESS:  This is a 75 year old gentleman with  constant pain in the left knee despite conservative treatment, steroid  ejection, pain medication, and knee arthroscopy.  He desires a surgical  intervention at this time.  All risks and benefits of surgery were  discussed with the patient.   PAST MEDICAL HISTORY:  Significant for peptic ulcer disease.   PAST SURGICAL HISTORY:  He has had two left knee arthroscopies and one  right knee arthroscopy and partial gastrectomy and left eye surgery.   SOCIAL HISTORY:  He is a nonsmoker.  He does not drink alcohol.   FAMILY HISTORY:  Noncontributory.   ALLERGIES:  He has no known drug allergies.   CURRENT MEDICATIONS:  1. Aleve 220 mg one p.o. daily p.r.n. pain.  2. Omeprazole 20 mg one p.o. b.i.d.  3. Hydrocodone 5/500 mg one p.o. b.i.d. p.r.n. pain.   PHYSICAL EXAMINATION:  Gross examination of the left knee demonstrates  the patient to have patellofemoral crepitance.  The range of motion is  estimated to be  0 to 130 degrees.  He has marked tenderness to  palpation along the medial and lateral joint lines.  He is  neurovascularly intact.   X-rays demonstrate a moderate arthritis of the left knee but he has bone-  on-bone degenerative changes documented by arthroscopy.   PREOPERATIVE LABORATORY DATA:  White blood cells 5.9, red blood cells  5.14, hemoglobin 15.6, hematocrit 46.1, platelets 209.  PT 13.1, INR  1.0.  Sodium 141, potassium 3.9, chloride 105, glucose 107, BUN 17,  creatinine 0.99.  Urinalysis was within normal  limits.   Garza COURSE:  [name] Garza was admitted to [name] [facility] on July 30, 2008 when he underwent left total knee arthroplasty.  The procedure  was performed by Dr. Gean Garza and the patient tolerated it well.  Two  Hemovac drains were placed into the left knee and a perioperative Foley  catheter was placed.  The patient was transferred to the orthopedic  floor and placed on Lovenox and Coumadin for postoperative DVT  prophylaxis.  On the first postoperative day, the patient was awake and  alert.  He complained of nausea and vomiting.  Hemoglobin was 11.8.  Surgical dressing was clean and dry.  He was evaluated by Physical  Therapy.  On the second postoperative day, the patient reported  improvement in his nausea and vomiting.  He was passing flatus and  tolerating p.o. intake well.  He had ambulated 15 feet with therapy on  the previous day, hemoglobin was 12.2.  Surgical dressing was changed  and his incision was found to be benign.  On  the third postoperative  day, the patient was awake and alert but complained of difficulty  urinating.  He denied any nausea or vomiting and continued to tolerate  p.o. intake well.  He had ambulated 40 feet with physical therapy on  previous day, hemoglobin was 10.4.  New urinalysis and culture were done  and found to be negative so the patient was given Flomax. On the fourth  postoperative day, the patient was voiding well, ambulating  independently and passing flatus so he was discharged home.   DISPOSITION:  The patient was discharged home on August 03, 2008.  [name] Garza will manage his Coumadin, physical therapy and wound.  He was  weightbearing as tolerated and will return to the clinic to see Dr.  Turner Garza in 1 week.  He was advised to follow up with his family physician  in regards to his urinary retention should he have any difficulty with  this in the future.   DISCHARGE MEDICINES:  As per the HMR with the addition of Percocet 5  mg  tablets one p.o. q.4 h. p.r.n. pain and Coumadin 5 mg take as directed  with a target INR of 1.522.   FINAL DIAGNOSIS:  End-stage degenerative joint disease of the left knee.      Justin Harris, PA      [name] Garza, M.D.  Electronically Signed    JW/MEDQ  D:  09/10/2008  T:  09/10/2008  Job:  [id number]

## 2011-01-02 NOTE — Op Note (Signed)
[name], [name]                ACCOUNT NO.:  [id number]   MEDICAL RECORD NO.:  [id number]          PATIENT TYPE:  OUT   LOCATION:  [facility]                         FACILITY:  [facility]   PHYSICIAN:  Feliberto Gottron. Turner Daniels, M.D.   DATE OF BIRTH:  [birth year]   DATE OF PROCEDURE:  01/19/2005  DATE OF DISCHARGE:  01/19/2005                                 OPERATIVE REPORT   PREOPERATIVE DIAGNOSIS:  Chondromalacia of left knee.   POSTOPERATIVE DIAGNOSIS:  Chondromalacia of left knee.   PROCEDURE:  Left knee arthroscopic removal of chondromalacia, medial femoral  condyle.   SURGEON:  Feliberto Gottron. Turner Daniels, M.D.   FIRST ASSISTANT:  Skip Mayer, P.A.-C.   ANESTHETIC:  Endotracheal.   ESTIMATED BLOOD LOSS:  Minimal.   FLUID REPLACEMENT:  800 mL of crystalloid.   DRAINS PLACED:  None.   TOURNIQUET TIME:  None.   INDICATIONS FOR PROCEDURE:  [age]-year-old gentleman with x-ray-proven  arthritic changes to his left knee, who now has mechanical symptoms of  catching, popping and locking with pain along the medial joint line.  He  either has a medial meniscal tear or chondromalacia of the medial femoral  condyle or medial tibial plateau, and having failed conservative treatment,  is taken for arthroscopic evaluation and treatment of same.   DESCRIPTION OF PROCEDURE:  The patient was identified by armband and taken  to the operating room at [facility].  Appropriate anesthetic  monitors were attached and general LMA anesthesia induced with the patient  in a supine position.  Lateral post was applied to the table and left lower  extremity prepped and draped in usual sterile fashion from the ankle the mid-  thigh.  Using a #11 blade, standard inferomedial and inferolateral  peripatellar portals were then made, allowing introduction of the  arthroscope through the inferolateral portal and the outflow through the  inferomedial portal.  Small bits of articular cartilage were noted to be in  the joint fluid and at this point, diagnostic arthroscopy revealed minimal  chondromalacia of the patella and trochlea; on moving to the medial femoral  condyle, more impressive chondromalacia with flap tears was noted to the  distal aspect of the medial femoral condyle and this was debrided with a 3.5  gator sucker shaver.  Medial and lateral menisci were intact, as were the  cruciate ligaments.  There were mild-to-moderate arthritic changes medially.  The gutters were cleared medially and laterally, as were the posterior  compartments, taking scope medial and lateral the PCL.  The knee was  then irrigated out normal saline solution, the arthroscopic instruments  removed and a dressing of Xeroform, 4x4 dressing sponges, Webril and an Ace  wrap applied.  The patient was then awakened and taken to the recovery room  without difficulty.       FJR/MEDQ  D:  02/02/2005  T:  02/02/2005  Job:  [id number]

## 2011-05-22 LAB — CBC
HCT: 30.5 % — ABNORMAL LOW (ref 39.0–52.0)
HCT: 34.1 % — ABNORMAL LOW (ref 39.0–52.0)
HCT: 35.7 % — ABNORMAL LOW (ref 39.0–52.0)
Hemoglobin: 10.4 g/dL — ABNORMAL LOW (ref 13.0–17.0)
Hemoglobin: 11.8 g/dL — ABNORMAL LOW (ref 13.0–17.0)
Hemoglobin: 12.2 g/dL — ABNORMAL LOW (ref 13.0–17.0)
MCHC: 33.8 g/dL (ref 30.0–36.0)
MCHC: 34.2 g/dL (ref 30.0–36.0)
MCV: 89.7 fL (ref 78.0–100.0)
MCV: 89.8 fL (ref 78.0–100.0)
MCV: 89.9 fL (ref 78.0–100.0)
RBC: 3.39 MIL/uL — ABNORMAL LOW (ref 4.22–5.81)
RBC: 3.82 MIL/uL — ABNORMAL LOW (ref 4.22–5.81)
RBC: 3.98 MIL/uL — ABNORMAL LOW (ref 4.22–5.81)
RDW: 13.3 % (ref 11.5–15.5)
WBC: 11.4 10*3/uL — ABNORMAL HIGH (ref 4.0–10.5)
WBC: 8.5 10*3/uL (ref 4.0–10.5)

## 2011-05-22 LAB — DIFFERENTIAL
Basophils Absolute: 0 10*3/uL (ref 0.0–0.1)
Basophils Relative: 1 % (ref 0–1)
Eosinophils Absolute: 0.1 10*3/uL (ref 0.0–0.7)
Eosinophils Relative: 1 % (ref 0–5)
Lymphocytes Relative: 37 % (ref 12–46)
Lymphs Abs: 2.2 10*3/uL (ref 0.7–4.0)
Monocytes Absolute: 0.3 10*3/uL (ref 0.1–1.0)
Monocytes Relative: 5 % (ref 3–12)
Neutro Abs: 3.3 10*3/uL (ref 1.7–7.7)
Neutrophils Relative %: 56 % (ref 43–77)

## 2011-05-22 LAB — TYPE AND SCREEN: Antibody Screen: NEGATIVE

## 2011-05-22 LAB — BASIC METABOLIC PANEL
BUN: 17 mg/dL (ref 6–23)
CO2: 26 mEq/L (ref 19–32)
Calcium: 8.2 mg/dL — ABNORMAL LOW (ref 8.4–10.5)
Calcium: 9.4 mg/dL (ref 8.4–10.5)
Chloride: 105 mEq/L (ref 96–112)
Creatinine, Ser: 0.99 mg/dL (ref 0.4–1.5)
GFR calc Af Amer: >60 mL/min (ref >60)
GFR calc Af Amer: >60 mL/min (ref >60)
GFR calc non Af Amer: >60 mL/min (ref >60)
Glucose, Bld: 107 mg/dL — ABNORMAL HIGH (ref 70–99)
Glucose, Bld: 121 mg/dL — ABNORMAL HIGH (ref 70–99)
Potassium: 3.6 mEq/L (ref 3.5–5.1)
Sodium: 137 mEq/L (ref 135–145)

## 2011-05-22 LAB — URINE CULTURE
Colony Count: NO GROWTH
Culture: NO GROWTH
Special Requests: NEGATIVE

## 2011-05-22 LAB — URINALYSIS, ROUTINE W REFLEX MICROSCOPIC
Glucose, UA: NEGATIVE mg/dL
Hgb urine dipstick: NEGATIVE
Ketones, ur: NEGATIVE mg/dL
Ketones, ur: NEGATIVE mg/dL
Leukocytes, UA: NEGATIVE
Nitrite: NEGATIVE
Protein, ur: NEGATIVE mg/dL
Specific Gravity, Urine: 1.028 (ref 1.005–1.030)
Urobilinogen, UA: 1 mg/dL (ref 0.0–1.0)
Urobilinogen, UA: 1 mg/dL (ref 0.0–1.0)
pH: 6 (ref 5.0–8.0)

## 2011-05-22 LAB — PROTIME-INR
INR: 1 (ref 0.00–1.49)
INR: 1.1 (ref 0.00–1.49)
Prothrombin Time: 22.1 seconds — ABNORMAL HIGH (ref 11.6–15.2)

## 2011-05-22 LAB — APTT: aPTT: 31 seconds (ref 24–37)

## 2020-06-02 ENCOUNTER — Encounter (HOSPITAL_COMMUNITY): Payer: Self-pay | Admitting: Internal Medicine

## 2020-06-02 ENCOUNTER — Inpatient Hospital Stay (HOSPITAL_COMMUNITY)
Admission: EM | Admit: 2020-06-02 | Discharge: 2020-06-17 | DRG: 177 | Disposition: E | Payer: Medicare HMO | Attending: Internal Medicine | Admitting: Internal Medicine

## 2020-06-02 ENCOUNTER — Emergency Department (HOSPITAL_COMMUNITY): Payer: Medicare HMO

## 2020-06-02 ENCOUNTER — Other Ambulatory Visit: Payer: Self-pay

## 2020-06-02 DIAGNOSIS — Z7189 Other specified counseling: Secondary | ICD-10-CM

## 2020-06-02 DIAGNOSIS — Z66 Do not resuscitate: Secondary | ICD-10-CM | POA: Diagnosis not present

## 2020-06-02 DIAGNOSIS — Z96659 Presence of unspecified artificial knee joint: Secondary | ICD-10-CM | POA: Diagnosis present

## 2020-06-02 DIAGNOSIS — M109 Gout, unspecified: Secondary | ICD-10-CM | POA: Diagnosis present

## 2020-06-02 DIAGNOSIS — F05 Delirium due to known physiological condition: Secondary | ICD-10-CM | POA: Diagnosis present

## 2020-06-02 DIAGNOSIS — J80 Acute respiratory distress syndrome: Secondary | ICD-10-CM | POA: Diagnosis present

## 2020-06-02 DIAGNOSIS — G928 Other toxic encephalopathy: Secondary | ICD-10-CM | POA: Diagnosis present

## 2020-06-02 DIAGNOSIS — U071 COVID-19: Principal | ICD-10-CM | POA: Diagnosis present

## 2020-06-02 DIAGNOSIS — H5462 Unqualified visual loss, left eye, normal vision right eye: Secondary | ICD-10-CM | POA: Diagnosis present

## 2020-06-02 DIAGNOSIS — Z515 Encounter for palliative care: Secondary | ICD-10-CM

## 2020-06-02 DIAGNOSIS — K712 Toxic liver disease with acute hepatitis: Secondary | ICD-10-CM | POA: Diagnosis not present

## 2020-06-02 DIAGNOSIS — R778 Other specified abnormalities of plasma proteins: Secondary | ICD-10-CM | POA: Diagnosis present

## 2020-06-02 DIAGNOSIS — I248 Other forms of acute ischemic heart disease: Secondary | ICD-10-CM | POA: Diagnosis present

## 2020-06-02 DIAGNOSIS — E872 Acidosis: Secondary | ICD-10-CM | POA: Diagnosis present

## 2020-06-02 DIAGNOSIS — J9601 Acute respiratory failure with hypoxia: Secondary | ICD-10-CM | POA: Diagnosis present

## 2020-06-02 DIAGNOSIS — Z87891 Personal history of nicotine dependence: Secondary | ICD-10-CM

## 2020-06-02 DIAGNOSIS — R739 Hyperglycemia, unspecified: Secondary | ICD-10-CM | POA: Diagnosis present

## 2020-06-02 DIAGNOSIS — J1282 Pneumonia due to coronavirus disease 2019: Secondary | ICD-10-CM | POA: Diagnosis present

## 2020-06-02 DIAGNOSIS — E860 Dehydration: Secondary | ICD-10-CM | POA: Diagnosis present

## 2020-06-02 DIAGNOSIS — T375X5A Adverse effect of antiviral drugs, initial encounter: Secondary | ICD-10-CM | POA: Diagnosis not present

## 2020-06-02 DIAGNOSIS — T380X5A Adverse effect of glucocorticoids and synthetic analogues, initial encounter: Secondary | ICD-10-CM | POA: Diagnosis not present

## 2020-06-02 DIAGNOSIS — G931 Anoxic brain damage, not elsewhere classified: Secondary | ICD-10-CM | POA: Diagnosis present

## 2020-06-02 DIAGNOSIS — R7989 Other specified abnormal findings of blood chemistry: Secondary | ICD-10-CM | POA: Diagnosis present

## 2020-06-02 DIAGNOSIS — R9431 Abnormal electrocardiogram [ECG] [EKG]: Secondary | ICD-10-CM | POA: Diagnosis present

## 2020-06-02 DIAGNOSIS — Z781 Physical restraint status: Secondary | ICD-10-CM

## 2020-06-02 DIAGNOSIS — R748 Abnormal levels of other serum enzymes: Secondary | ICD-10-CM | POA: Diagnosis present

## 2020-06-02 DIAGNOSIS — N179 Acute kidney failure, unspecified: Secondary | ICD-10-CM | POA: Diagnosis present

## 2020-06-02 DIAGNOSIS — R7401 Elevation of levels of liver transaminase levels: Secondary | ICD-10-CM | POA: Diagnosis present

## 2020-06-02 DIAGNOSIS — E870 Hyperosmolality and hypernatremia: Secondary | ICD-10-CM | POA: Diagnosis not present

## 2020-06-02 HISTORY — DX: Gout, unspecified: M10.9

## 2020-06-02 LAB — HEPATITIS B SURFACE ANTIGEN: Hepatitis B Surface Ag: NONREACTIVE

## 2020-06-02 LAB — I-STAT ARTERIAL BLOOD GAS, ED
Acid-Base Excess: 0 mmol/L (ref 0.0–2.0)
Acid-Base Excess: 0 mmol/L (ref 0.0–2.0)
Bicarbonate: 24.4 mmol/L (ref 20.0–28.0)
Bicarbonate: 26.1 mmol/L (ref 20.0–28.0)
Calcium, Ion: 1.08 mmol/L — ABNORMAL LOW (ref 1.15–1.40)
Calcium, Ion: 1.13 mmol/L — ABNORMAL LOW (ref 1.15–1.40)
HCT: 40 % (ref 39.0–52.0)
HCT: 43 % (ref 39.0–52.0)
Hemoglobin: 13.6 g/dL (ref 13.0–17.0)
Hemoglobin: 14.6 g/dL (ref 13.0–17.0)
O2 Saturation: 20 %
O2 Saturation: 86 %
Patient temperature: 97.9
Potassium: 3.6 mmol/L (ref 3.5–5.1)
Potassium: 3.7 mmol/L (ref 3.5–5.1)
Sodium: 141 mmol/L (ref 135–145)
Sodium: 141 mmol/L (ref 135–145)
TCO2: 26 mmol/L (ref 22–32)
TCO2: 28 mmol/L (ref 22–32)
pCO2 arterial: 39.2 mmHg (ref 32.0–48.0)
pCO2 arterial: 48.4 mmHg — ABNORMAL HIGH (ref 32.0–48.0)
pH, Arterial: 7.34 — ABNORMAL LOW (ref 7.350–7.450)
pH, Arterial: 7.399 (ref 7.350–7.450)
pO2, Arterial: 16 mmHg — CL (ref 83.0–108.0)
pO2, Arterial: 50 mmHg — ABNORMAL LOW (ref 83.0–108.0)

## 2020-06-02 LAB — COMPREHENSIVE METABOLIC PANEL
ALT: 31 U/L (ref 0–44)
AST: 101 U/L — ABNORMAL HIGH (ref 15–41)
Albumin: 2.8 g/dL — ABNORMAL LOW (ref 3.5–5.0)
Alkaline Phosphatase: 154 U/L — ABNORMAL HIGH (ref 38–126)
Anion gap: 15 (ref 5–15)
BUN: 35 mg/dL — ABNORMAL HIGH (ref 8–23)
CO2: 23 mmol/L (ref 22–32)
Calcium: 8.2 mg/dL — ABNORMAL LOW (ref 8.9–10.3)
Chloride: 102 mmol/L (ref 98–111)
Creatinine, Ser: 1.97 mg/dL — ABNORMAL HIGH (ref 0.61–1.24)
GFR, Estimated: 31 mL/min — ABNORMAL LOW (ref >60)
Glucose, Bld: 143 mg/dL — ABNORMAL HIGH (ref 70–99)
Potassium: 3.7 mmol/L (ref 3.5–5.1)
Sodium: 140 mmol/L (ref 135–145)
Total Bilirubin: 0.9 mg/dL (ref 0.3–1.2)
Total Protein: 6.3 g/dL — ABNORMAL LOW (ref 6.5–8.1)

## 2020-06-02 LAB — CBC WITH DIFFERENTIAL/PLATELET
Abs Immature Granulocytes: 0.05 10*3/uL (ref 0.00–0.07)
Basophils Absolute: 0 10*3/uL (ref 0.0–0.1)
Basophils Relative: 0 %
Eosinophils Absolute: 0 10*3/uL (ref 0.0–0.5)
Eosinophils Relative: 0 %
HCT: 47.4 % (ref 39.0–52.0)
Hemoglobin: 15.1 g/dL (ref 13.0–17.0)
Immature Granulocytes: 1 %
Lymphocytes Relative: 9 %
Lymphs Abs: 0.6 10*3/uL — ABNORMAL LOW (ref 0.7–4.0)
MCH: 29.8 pg (ref 26.0–34.0)
MCHC: 31.9 g/dL (ref 30.0–36.0)
MCV: 93.5 fL (ref 80.0–100.0)
Monocytes Absolute: 0.7 10*3/uL (ref 0.1–1.0)
Monocytes Relative: 12 %
Neutro Abs: 4.8 10*3/uL (ref 1.7–7.7)
Neutrophils Relative %: 78 %
Platelets: 151 10*3/uL (ref 150–400)
RBC: 5.07 MIL/uL (ref 4.22–5.81)
RDW: 14.6 % (ref 11.5–15.5)
WBC: 6.1 10*3/uL (ref 4.0–10.5)
nRBC: 0 % (ref 0.0–0.2)

## 2020-06-02 LAB — LACTIC ACID, PLASMA
Lactic Acid, Venous: 5.6 mmol/L (ref 0.5–1.9)
Lactic Acid, Venous: 3.2 mmol/L (ref 0.5–1.9)

## 2020-06-02 LAB — LACTATE DEHYDROGENASE: LDH: 529 U/L — ABNORMAL HIGH (ref 98–192)

## 2020-06-02 LAB — PROCALCITONIN: Procalcitonin: 1.39 ng/mL

## 2020-06-02 LAB — TROPONIN I (HIGH SENSITIVITY)
Troponin I (High Sensitivity): 117 ng/L (ref <18)
Troponin I (High Sensitivity): 149 ng/L (ref <18)
Troponin I (High Sensitivity): 172 ng/L (ref <18)

## 2020-06-02 LAB — TRIGLYCERIDES: Triglycerides: 140 mg/dL (ref <150)

## 2020-06-02 LAB — FERRITIN: Ferritin: 1482 ng/mL — ABNORMAL HIGH (ref 24–336)

## 2020-06-02 LAB — RESPIRATORY PANEL BY RT PCR (FLU A&B, COVID)
Influenza A by PCR: NEGATIVE
Influenza B by PCR: NEGATIVE
SARS Coronavirus 2 by RT PCR: POSITIVE — AB

## 2020-06-02 LAB — C-REACTIVE PROTEIN: CRP: 19.8 mg/dL — ABNORMAL HIGH (ref <1.0)

## 2020-06-02 LAB — FIBRINOGEN: Fibrinogen: >800 mg/dL — ABNORMAL HIGH (ref 210–475)

## 2020-06-02 LAB — BRAIN NATRIURETIC PEPTIDE: B Natriuretic Peptide: 147.8 pg/mL — ABNORMAL HIGH (ref 0.0–100.0)

## 2020-06-02 LAB — D-DIMER, QUANTITATIVE: D-Dimer, Quant: 1.12 ug/mL-FEU — ABNORMAL HIGH (ref 0.00–0.50)

## 2020-06-02 MED ORDER — HYDROCOD POLST-CPM POLST ER 10-8 MG/5ML PO SUER: 5.0000 mL | Freq: Two times a day (BID) | ORAL | Status: DC | PRN

## 2020-06-02 MED ORDER — ZINC SULFATE 220 (50 ZN) MG PO CAPS
220.0000 mg | ORAL_CAPSULE | Freq: Every day | ORAL | Status: DC
  Administered 2020-06-02: 220 mg via ORAL
  Filled 2020-06-02 (×2): qty 1

## 2020-06-02 MED ORDER — ENOXAPARIN SODIUM 30 MG/0.3ML ~~LOC~~ SOLN
30.0000 mg | SUBCUTANEOUS | Status: DC
  Administered 2020-06-02 – 2020-06-03 (×2): 30 mg via SUBCUTANEOUS
  Filled 2020-06-02 (×2): qty 0.3

## 2020-06-02 MED ORDER — SODIUM CHLORIDE 0.9 % IV SOLN
500.0000 mg | INTRAVENOUS | Status: DC
  Administered 2020-06-02: 500 mg via INTRAVENOUS
  Filled 2020-06-02: qty 500

## 2020-06-02 MED ORDER — SODIUM CHLORIDE 0.9 % IV SOLN: 100.0000 mg | Freq: Every day | INTRAVENOUS | Status: DC

## 2020-06-02 MED ORDER — LACTATED RINGERS IV BOLUS
500.0000 mL | Freq: Once | INTRAVENOUS | Status: AC
  Administered 2020-06-02: 500 mL via INTRAVENOUS

## 2020-06-02 MED ORDER — METHYLPREDNISOLONE SODIUM SUCC 125 MG IJ SOLR
125.0000 mg | Freq: Once | INTRAMUSCULAR | Status: AC
  Administered 2020-06-02: 125 mg via INTRAVENOUS
  Filled 2020-06-02: qty 2

## 2020-06-02 MED ORDER — ONDANSETRON HCL 4 MG/2ML IJ SOLN
4.0000 mg | Freq: Four times a day (QID) | INTRAMUSCULAR | Status: DC | PRN
  Administered 2020-06-02 – 2020-06-03 (×2): 4 mg via INTRAVENOUS
  Filled 2020-06-02 (×3): qty 2

## 2020-06-02 MED ORDER — LACTATED RINGERS IV SOLN: INTRAVENOUS | Status: DC

## 2020-06-02 MED ORDER — ASCORBIC ACID 500 MG PO TABS
500.0000 mg | ORAL_TABLET | Freq: Every day | ORAL | Status: DC
  Administered 2020-06-02: 500 mg via ORAL
  Filled 2020-06-02 (×2): qty 1

## 2020-06-02 MED ORDER — SODIUM CHLORIDE 0.9 % IV SOLN
200.0000 mg | Freq: Once | INTRAVENOUS | Status: AC
  Administered 2020-06-02: 200 mg via INTRAVENOUS
  Filled 2020-06-02: qty 40

## 2020-06-02 MED ORDER — ONDANSETRON HCL 4 MG PO TABS: 4.0000 mg | ORAL_TABLET | Freq: Four times a day (QID) | ORAL | Status: DC | PRN

## 2020-06-02 MED ORDER — GUAIFENESIN-DM 100-10 MG/5ML PO SYRP
10.0000 mL | ORAL_SOLUTION | ORAL | Status: DC | PRN
  Administered 2020-06-03: 10 mL via ORAL
  Filled 2020-06-02: qty 10

## 2020-06-02 MED ORDER — BARICITINIB 2 MG PO TABS: 2.0000 mg | ORAL_TABLET | Freq: Every day | ORAL | Status: DC

## 2020-06-02 MED ORDER — ALBUTEROL SULFATE HFA 108 (90 BASE) MCG/ACT IN AERS
2.0000 | INHALATION_SPRAY | Freq: Four times a day (QID) | RESPIRATORY_TRACT | Status: DC
  Administered 2020-06-02 – 2020-06-03 (×3): 2 via RESPIRATORY_TRACT
  Filled 2020-06-02 (×2): qty 6.7

## 2020-06-02 MED ORDER — FAMOTIDINE IN NACL 20-0.9 MG/50ML-% IV SOLN
20.0000 mg | INTRAVENOUS | Status: DC
  Administered 2020-06-02 – 2020-06-07 (×6): 20 mg via INTRAVENOUS
  Filled 2020-06-02 (×6): qty 50

## 2020-06-02 MED ORDER — PREDNISONE 20 MG PO TABS
50.0000 mg | ORAL_TABLET | Freq: Every day | ORAL | Status: DC
  Filled 2020-06-02: qty 1

## 2020-06-02 MED ORDER — SODIUM CHLORIDE 0.9 % IV SOLN
1.0000 g | INTRAVENOUS | Status: DC
  Administered 2020-06-02: 1 g via INTRAVENOUS
  Filled 2020-06-02: qty 10

## 2020-06-02 MED ORDER — METHYLPREDNISOLONE SODIUM SUCC 125 MG IJ SOLR
1.0000 mg/kg | Freq: Two times a day (BID) | INTRAMUSCULAR | Status: DC
  Administered 2020-06-03 – 2020-06-05 (×5): 75 mg via INTRAVENOUS
  Filled 2020-06-02 (×5): qty 2

## 2020-06-02 MED ORDER — SODIUM CHLORIDE 0.9 % IV SOLN: 200.0000 mg | Freq: Once | INTRAVENOUS | Status: DC

## 2020-06-02 MED ORDER — ACETAMINOPHEN 325 MG PO TABS: 650.0000 mg | ORAL_TABLET | Freq: Four times a day (QID) | ORAL | Status: DC | PRN

## 2020-06-02 MED ORDER — BARICITINIB 2 MG PO TABS
2.0000 mg | ORAL_TABLET | Freq: Every day | ORAL | Status: DC
  Filled 2020-06-02 (×2): qty 1

## 2020-06-02 NOTE — ED Notes (Signed)
Patient resting comfortably on bi-pap at this time 

## 2020-06-02 NOTE — ED Notes (Signed)
Reached out to respiratory about patient status and update of plan of care.

## 2020-06-02 NOTE — Consult Note (Signed)
NAME:  [name], MRN:  [id number], DOB:  May 18, 1936, LOS: 0 ADMISSION DATE:  05/29/2020, CONSULTATION DATE: 05/21/2020 REFERRING MD: Dr. Jacqulyn Bath, CHIEF COMPLAINT: Hypoxemic respiratory failure  Brief History     History of present illness   84 year old unvaccinated man, former smoker (he is unable to tell me how much), history of gout.  He began to experience dyspnea and generalized weakness, coughing, lethargy over the last 2 to 3 days.  Apparently his wife and grandchildren are COVID-19 positive.  On the morning of admission he could barely walk through the house, was cyanotic in appearance.  EMS was called and he was severely desaturated, placed on 1.00 mask and brought to the ED.  In the emergency department was initially tolerating 15 L/min [place].  COVID-19 testing positive, lactic acidosis noted 5.6.  Chest x-ray with diffuse bilateral pulmonary infiltrates consistent with ARDS.  Other evaluation revealed acute renal failure, mild transaminitis.  Inflammatory indices were all elevated including procalcitonin 1.39.  Admitted by [facility] and housing in the ED. He had increased work of breathing, progressive hypoxemia after trying to get to the side of the bed to urinate.  He was changed to high flow nasal cannula, uptitrated to 70 L/min but continued to have tachypnea and desaturations.  He is now being placed on BiPAP.  PCCM called to evaluate.  Dr. Jacqulyn Bath spoke with the patient's son by phone, confirmed that he would want all aggressive care including mechanical ventilation.  Past Medical History  Gout  Significant Hospital Events     Consults:    Procedures:    Significant Diagnostic Tests:     Micro Data:  SARSCoV2 10/17 >> positive Flu A/B 10/17 >> negative   Antimicrobials:  Remdesivir 10/17 x 1 Azithromycin 10/17 >>  Ceftriaxone 10/17 >>   Solu-Medrol 10/17 >> Baricitinib 10/17 >>   Interim history/subjective:  Saturations rebounding with initiation of BiPAP. Patient  nods his head that he feels more comfortable with BiPAP in place. Some difficulty understanding him but is able to communicate  Objective   Blood pressure (!) 147/87, pulse 76, temperature 98.9 F (37.2 C), temperature source Rectal, resp. rate (!) 32, weight 75 kg, SpO2 100 %.    FiO2 (%):  [80 %-100 %] 100 %   Intake/Output Summary (Last 24 hours) at 06/05/2020 1741 Last data filed at 06/06/2020 1614 Gross per 24 hour  Intake 846.75 ml  Output --  Net 846.75 ml   Filed Weights   06/14/2020 1400  Weight: 75 kg    Examination: General: Ill-appearing elderly man, on BiPAP HENT: Oropharynx appears to be clear, mask in place, pupils equal Lungs: Bilateral inspiratory crackles, no wheezing Cardiovascular: Tachycardic, distant, no murmur Abdomen: Soft, nondistended, positive bowel sounds Extremities: No significant edema Neuro: Awake, alert, nodding to questions, attempting to communicate but difficult to understand partly due to mask in place.  He does follow commands.  Resolved Hospital Problem list     Assessment & Plan:  Acute hypoxemic respiratory failure due to COVID-19 pneumonia and ARDS -Stabilizing with BiPAP in place.  Hopefully will only require for short period of time, transition back to high flow nasal cannula.  At very high risk for further decompensation, mechanical ventilation.  Initial discussions with family have shown support for all aggressive care.  Will try to discuss with him further. -Stop remdesivir -Continue high-dose steroids as ordered -Start baricitinib -Follow inflammatory markers  Acute renal failure -Follow BMP, urine output with restoration adequate oxygenation  Lactic acidosis -Clearing  with support, continue to follow  Mild elevation high-sensitivity troponin, likely secondary to the above -Follow troponin.  No evidence of ACS  Transaminitis, suspect due to COVID-19 -Follow LFT, coags  Toxic metabolic and hypoxemic encephalopathy -Is  able to interact but difficult to assess whether he has insight into his current situation, disease. -Minimize sedating medications -Supportive care  Best practice:  Diet: NPO Pain/Anxiety/Delirium protocol (if indicated): N/A VAP protocol (if indicated): N/A DVT prophylaxis: Enoxaparin GI prophylaxis: Pepcid Glucose control: Initiate SSI if CBG consistently greater than 180 Mobility: Bedrest Code Status: Full Family Communication: Good discussion with the patient's son Reita Cliche by phone. I explained the severity of pt's illness, the very guarded prognosis, the level of current support. I also explained that I do not believe his father would survive intubation / MV. I recommended that we continue BiPAP as needed, all aggressive medical support, but that we defer intubation and MV. He understood and agreed. He is quite concerned that he and family cannot visit. I did assure him that we would do everything in our power to allow visitation should pt decline, look like he was going to pass. Will need to continue to discuss overall code status going forward - I did not specifically address defib, chest compressions with him.  Disposition: Change to ICU status  Labs   CBC: Recent Labs  Lab 06/18/20 1204 06-18-2020 1210 06/18/20 1225  WBC 6.1  --   --   NEUTROABS 4.8  --   --   HGB 15.1 14.6 13.6  HCT 47.4 43.0 40.0  MCV 93.5  --   --   PLT 151  --   --     Basic Metabolic Panel: Recent Labs  Lab 18-Jun-2020 1204 18-Jun-2020 1210 06-18-20 1225  NA 140 141 141  K 3.7 3.7 3.6  CL 102  --   --   CO2 23  --   --   GLUCOSE 143*  --   --   BUN 35*  --   --   CREATININE 1.97*  --   --   CALCIUM 8.2*  --   --    GFR: CrCl cannot be calculated (Unknown ideal weight.). Recent Labs  Lab 2020/06/18 1204 06-18-20 1422  PROCALCITON 1.39  --   WBC 6.1  --   LATICACIDVEN 5.6* 3.2*    Liver Function Tests: Recent Labs  Lab 06/18/2020 1204  AST 101*  ALT 31  ALKPHOS 154*  BILITOT 0.9  PROT  6.3*  ALBUMIN 2.8*   No results for input(s): LIPASE, AMYLASE in the last 168 hours. No results for input(s): AMMONIA in the last 168 hours.  ABG    Component Value Date/Time   PHART 7.399 2020-06-18 1225   PCO2ART 39.2 2020/06/18 1225   PO2ART 50 (L) Jun 18, 2020 1225   HCO3 24.4 June 18, 2020 1225   TCO2 26 06/18/2020 1225   O2SAT 86.0 06-18-2020 1225     Coagulation Profile: No results for input(s): INR, PROTIME in the last 168 hours.  Cardiac Enzymes: No results for input(s): CKTOTAL, CKMB, CKMBINDEX, TROPONINI in the last 168 hours.  HbA1C: No results found for: HGBA1C  CBG: No results for input(s): GLUCAP in the last 168 hours.  Review of Systems:   As per hpi  Past Medical History  He,  has a past medical history of Gout.   Surgical History   History reviewed. No pertinent surgical history.   Social History   reports that he has quit smoking. He has  quit using smokeless tobacco. He reports that he does not drink alcohol and does not use drugs.   Family History   His family history includes Cancer in his mother.   Allergies No Known Allergies   Home Medications  Prior to Admission medications   Medication Sig Start Date End Date Taking? Authorizing Provider  Acetaminophen (APAP PO) Take 1 tablet by mouth every 6 (six) hours as needed (pain).     [provider]  allopurinol (ZYLOPRIM) 300 MG tablet Take 300 mg by mouth daily. 02/03/20   [provider]  indomethacin (INDOCIN) 50 MG capsule Take 50 mg by mouth 2 (two) times daily with a meal. 05/25/20   [provider]     Critical care time: 45 min     Levy Pupa, MD, PhD 06/07/2020, 6:12 PM [facility] [phone] or if no answer [phone]

## 2020-06-02 NOTE — ED Notes (Signed)
   06/13/2020 2110  Provider Notification  Provider Name/Title Dr. Koren Bound  Date Provider Notified 06/13/20  Time Provider Notified 2111  Notification Type  (secure message)  Notification Reason  (bed placement requesting bed assignment change from progressive to ICU)  Response See new orders  Date of Provider Response 06-13-20  Time of Provider Response 2112

## 2020-06-02 NOTE — Progress Notes (Signed)
RT was called by RN to assess pt. RT at bedside to find pt had used a urinal while sitting in bed. Pt then started to desat to 76%-80% while on NRB 15L along with 15L [place]. Pt continue to remain in the 70's and became tachypneic (in the 30's) and mottling skin. RT then placed pt on HHFNC 60L 100% with no improvement. Pt continued to remain in the 70's. RT had pt lay on his left side to help with no improvement. RT spoke with Dr. Jacqulyn Bath about pt's condition. She gave verbal orders to place pt on BiPAP. Pt is no on BiPAP tolerating well and sating at 100% on 100% FiO2. RT will continue to monitor pt.

## 2020-06-02 NOTE — ED Triage Notes (Signed)
Pt living with family who are positive for Covid 19, intermittent shob x 2 days, increasing today. Initial room air SpO2 32%, pt cyanotic on EMS arrival. Alert but confused.

## 2020-06-02 NOTE — ED Notes (Signed)
Answering to monitor alarms, pt was trying to get to end of bed, sheet and gown off, IVs x 2 dressing almost off, Bipap disconnected, SpO2 down to 70's.  Bipap reconnected, ekg leads re-applied and IV x 2 dressing changed and secured. Reassurance given, SpO2 increased to 96%, pt calm at this time.  TV turned on and call light within reach.

## 2020-06-02 NOTE — ED Notes (Signed)
Respiratory at bedside.

## 2020-06-02 NOTE — ED Notes (Signed)
Responded to alarm, oxygen sats in 60's, into pts room, bipap disconnected, pt anxious, bipap reconnected and oxygen sats quickly increase to 96%.  Reassurance given.

## 2020-06-02 NOTE — ED Notes (Signed)
Message sent to bed placement, [facility], Dr. Arlean Hopping put in ICU bed then d/c'd ICU bed, message from MD said that progressive bed is OK.

## 2020-06-02 NOTE — ED Provider Notes (Signed)
[facility] EMERGENCY DEPARTMENT Provider Note   CSN: [id number] Arrival date & time: June 08, 2020  1144     History Chief Complaint  Patient presents with  . Respiratory Distress    [name] is a 84 y.o. male.  HPI My EMS report per family, at baseline patient is a healthy 84 year old only medical problem gout.  He is active and lives independently at home with his wife and possibly grown children.  Patient became ill over the past 2 to 3 days.  He reports his main problem is being severely weak in the legs.  He reports today he was too weak to stand up.  Family reports that they have had positive members with Covid diagnosis in the home.  Reportedly patient has been coughing and appearing short of breath, severe shortness of breath this morning.  EMS reports on their arrival they documented an oxygen saturation of 32%.  They report the patient was mottled and cyanotic in appearance.  They applied nonrebreather mask.  During transport oxygen saturations increased to the low 80s.Blood  Pressures were stable, patient was awake and protecting his airway.  Patient is awake and can answer questions.  There is some communication barrier due to combination of patient appearing hard of hearing and possibly mild confusion regarding the questions.  Additional history obtained from the patient's son.  Son reports that the patient's wife that the positive for Covid a week ago.  Grandchildren are also positive.  Patient became sick 2 days ago.  Yesterday he was very fatigued and weak.  He only got up once.  He took 2 Tylenol and vomited yesterday.  Patient does not have CODE STATUS on chart.  Patient son reports that in prior discussions the patient would not want aggressive resuscitation if severely ill.  Patient son has Dorette Grate going on a ventilator if there is chance of recovery, if patient is declining they would not want to proceed with likely futile type treatments in the face of  serious illness.  Patient son recently discharged from the hospital status post open heart surgery.  Patient son reports he has not tested positive for Covid.  Patient son is quite adamant that he wants to come back to be with his father regardless of risk to himself.  Advised that this is not safe and will review policies and update him.  Patient son advises he will sign off on any kind of paperwork stating he understands risk of death and injury to himself.    No past medical history on file.  There are no problems to display for this patient.        No family history on file.  Social History   Tobacco Use  . Smoking status: Not on file  Substance Use Topics  . Alcohol use: Not on file  . Drug use: Not on file    Home Medications Prior to Admission medications   Not on File    Allergies    Patient has no known allergies.  Review of Systems   Review of Systems Level 5 caveat cannot obtain reliable review of systems at this time.  Patient in moderate/severe distress and many distractions and noise surrounding in room. Physical Exam Updated Vital Signs BP (!) 146/74   Pulse 67   Temp 98.9 F (37.2 C) (Rectal)   Resp (!) 25   SpO2 90%   Physical Exam Constitutional:      Comments: Patient is awake.  Eyes open.  He  has good eye contact.  He is responding appropriately to verbal interactions.  Difficult to understand due to combination of masks, surrounding noise and slightly mumbled speech.  Patient does not exhibit severe respiratory distress at rest.  He is pale and ill in appearance.  HENT:     Head: Normocephalic and atraumatic.     Mouth/Throat:     Mouth: Mucous membranes are dry.     Pharynx: Oropharynx is clear.  Eyes:     Extraocular Movements: Extraocular movements intact.     Pupils: Pupils are equal, round, and reactive to light.  Cardiovascular:     Rate and Rhythm: Normal rate and regular rhythm.     Pulses: Normal pulses.     Heart sounds: Normal  heart sounds.  Pulmonary:     Comments: Tachypnea but not exhibiting severe distress.  Airflow is present to the bases of both lungs but there are crackles throughout.  Left lung is slightly diminished compared to right. Abdominal:     General: There is no distension.     Palpations: Abdomen is soft.     Tenderness: There is no abdominal tenderness. There is no guarding.  Musculoskeletal:     Comments: No peripheral edema. Extremities are in good condition. Well-developed and no wounds or swelling. Dorsalis pedis pulses are 2+ and strong. Feet and lower legs however are cool to touch. Slight reticular mottling present on extremities.  Neurological:     Comments: Patient is awake and alert in appearance. He appears mildly confused. Speech is generally appropriate for the situation. No focal motor deficits. He is moving all extremities appropriately. He can follow commands for strength testing in the lower extremities. I can put the extremities in flexion and he has good strength to push against resistance.  Psychiatric:     Comments: Mild confusion.     ED Results / Procedures / Treatments   Labs (all labs ordered are listed, but only abnormal results are displayed) Labs Reviewed  RESPIRATORY PANEL BY RT PCR (FLU A&B, COVID) - Abnormal; Notable for the following components:      Result Value   SARS Coronavirus 2 by RT PCR POSITIVE (*)    All other components within normal limits  LACTIC ACID, PLASMA - Abnormal; Notable for the following components:   Lactic Acid, Venous 5.6 (*)    All other components within normal limits  CBC WITH DIFFERENTIAL/PLATELET - Abnormal; Notable for the following components:   Lymphs Abs 0.6 (*)    All other components within normal limits  COMPREHENSIVE METABOLIC PANEL - Abnormal; Notable for the following components:   Glucose, Bld 143 (*)    BUN 35 (*)    Creatinine, Ser 1.97 (*)    Calcium 8.2 (*)    Total Protein 6.3 (*)    Albumin 2.8 (*)    AST  101 (*)    Alkaline Phosphatase 154 (*)    GFR, Estimated 31 (*)    All other components within normal limits  D-DIMER, QUANTITATIVE (NOT AT [facility]) - Abnormal; Notable for the following components:   D-Dimer, Quant 1.12 (*)    All other components within normal limits  LACTATE DEHYDROGENASE - Abnormal; Notable for the following components:   LDH 529 (*)    All other components within normal limits  FERRITIN - Abnormal; Notable for the following components:   Ferritin 1,482 (*)    All other components within normal limits  FIBRINOGEN - Abnormal; Notable for the following components:  Fibrinogen >800 (*)    All other components within normal limits  C-REACTIVE PROTEIN - Abnormal; Notable for the following components:   CRP 19.8 (*)    All other components within normal limits  I-STAT ARTERIAL BLOOD GAS, ED - Abnormal; Notable for the following components:   pH, Arterial 7.340 (*)    pCO2 arterial 48.4 (*)    pO2, Arterial 16 (*)    Calcium, Ion 1.08 (*)    All other components within normal limits  I-STAT ARTERIAL BLOOD GAS, ED - Abnormal; Notable for the following components:   pO2, Arterial 50 (*)    Calcium, Ion 1.13 (*)    All other components within normal limits  TROPONIN I (HIGH SENSITIVITY) - Abnormal; Notable for the following components:   Troponin I (High Sensitivity) 117 (*)    All other components within normal limits  CULTURE, BLOOD (ROUTINE X 2)  CULTURE, BLOOD (ROUTINE X 2)  PROCALCITONIN  TRIGLYCERIDES  LACTIC ACID, PLASMA  BRAIN NATRIURETIC PEPTIDE  BLOOD GAS, ARTERIAL  TROPONIN I (HIGH SENSITIVITY)    EKG EKG Interpretation  Date/Time:  Sunday June 02 2020 11:49:26 EDT Ventricular Rate:  73 PR Interval:    QRS Duration: 103 QT Interval:  414 QTC Calculation: 457 R Axis:   93 Text Interpretation: Sinus rhythm Probable left atrial enlargement Right axis deviation inferior q wave lead III and S wave aVL, otherwise no change from old Confirmed by  Arby BarrettePfeiffer, Calia Napp [phone]([id number]) on 05/18/2020 1:49:32 PM   Radiology DG Chest Portable 1 View  Result Date: 05/28/2020 CLINICAL DATA:  Shortness of breath. Family is positive for COVID-19. EXAM: PORTABLE CHEST 1 VIEW COMPARISON:  None. FINDINGS: Bilateral pulmonary infiltrates are identified. The heart, hila, mediastinum, lungs, and pleura are otherwise unremarkable. IMPRESSION: Bilateral pulmonary infiltrates are identified. Given the history of COVID-19 exposure, the findings could represent COVID-19 pneumonia. Recommend clinical correlation. Electronically Signed   By: Gerome Samavid  Williams III M.D   On: 06/15/2020 12:42    Procedures Procedures (including critical care time) CRITICAL CARE Performed by: Arby BarretteMarcy Abrie Egloff   Total critical care time: 60 minutes  Critical care time was exclusive of separately billable procedures and treating other patients.  Critical care was necessary to treat or prevent imminent or life-threatening deterioration.  Critical care was time spent personally by me on the following activities: development of treatment plan with patient and/or surrogate as well as nursing, discussions with consultants, evaluation of patient's response to treatment, examination of patient, obtaining history from patient or surrogate, ordering and performing treatments and interventions, ordering and review of laboratory studies, ordering and review of radiographic studies, pulse oximetry and re-evaluation of patient's condition. Medications Ordered in ED Medications  lactated ringers infusion (has no administration in time range)  methylPREDNISolone sodium succinate (SOLU-MEDROL) 125 mg/2 mL injection 125 mg (has no administration in time range)  remdesivir 200 mg in sodium chloride 0.9% 250 mL IVPB (has no administration in time range)    Followed by  remdesivir 100 mg in sodium chloride 0.9 % 100 mL IVPB (has no administration in time range)  lactated ringers bolus 500 mL (500 mLs  Intravenous New Bag/Given 05/31/2020 1317)    ED Course  I have reviewed the triage vital signs and the nursing notes.  Pertinent labs & imaging results that were available during my care of the patient were reviewed by me and considered in my medical decision making (see chart for details).    MDM Rules/Calculators/A&P  Patient presents as outlined above.  He has had Covid exposure and test positive.  It does appear to advanced quite quickly.  Patient has bilateral pneumonia.  He was profoundly hypoxic on EMS arrival.  He has been responding to supplemental oxygen.  Patient arrived on nonrebreather mask with oxygen saturations mid to upper 80s.  On nonrebreather mask patient did have oxygen saturation up to 90s.  Patient has been transitioned to high flow nasal cannula.  Saturations maintaining around 90%.  Patient remains awake and alert, however he does have mild to moderate confusion.  Suspect early delirium.  He does appear dehydrated.  Significant generalized weakness was a chief complaint.  I have initiated lactated Ringer's fluid resuscitation in small increments.  Blood pressures have remained stable.  Solu-Medrol and remdesivir ordered upon return of Covid result.  Will admit to medical service. Final Clinical Impression(s) / ED Diagnoses Final diagnoses:  Pneumonia due to COVID-19 virus  Acute respiratory failure with hypoxia [facility])    Rx / DC Orders ED Discharge Orders    None       Arby Barrette, MD 28-Jun-2020 1353

## 2020-06-02 NOTE — H&P (Addendum)
History and Physical    Everest Brod ZOX:[id number] DOB: 1935-12-03 DOA: 06/25/2020  PCP: Patient, No Pcp Per  Patient coming from: Home  I have personally briefly reviewed patient's old medical records in [facility]  Chief Complaint: Shortness of breath since 2 days  HPI: [name] is a 84 y.o. male with medical history significant of gout, former smoker presents to emergency department with worsening shortness of breath since 2 days.  Patient tells me that he became short of breath since 2 days.  Reports generalized weakness, has no energy.  His wife and grandchildren recently tested positive for COVID-19.  Patient tells me that he was unable to walk due to weakness this morning, reports cough, shortness of breath especially with walking.  He took Tylenol with little to no help and vomited yesterday.  Family called EMS and was noted to have oxygen saturation of 32% and was cyanotic in appearance.  They applied nonrebreather mask and oxygen saturation improved to 80%.  No history of headache, chest pain, blurry vision, lightheadedness, dizziness, wheezing, leg swelling, recent travel, immobilization, abdominal pain, urinary or bowel changes.  Former smoker, denies alcohol, illicit drug use.  He is very healthy 84 year old male with history of gout and independent on daily life activities.  He and his family is not vaccinated against COVID-19.  ED Course: Upon arrival to ED: Patient tachypneic, hypoxic requiring 50 L of oxygen via nonrebreather mask.  Lactic acid: 5.6, no leukocytosis, COVID-19 positive, CMP shows AST of 101, AKI, alkaline phosphatase: 154, D-dimer: 1.12, procalcitonin: 1.39, elevated all inflammatory markers, CRP: 19.8, initial troponin: 117, EKG no acute ST-T wave changes.  Chest x-ray concerning for bilateral multifocal pneumonia concerning for COVID-19.  Patient was given Solu-Medrol and remdesivir in ED.  [facility] hospitalist consulted for admission for acute hypoxemic  respiratory failure secondary to COVID-19.  Review of Systems: As per HPI otherwise negative.    Past Medical History:  Diagnosis Date  . Gout     History reviewed. No pertinent surgical history.   reports that he has quit smoking. He has quit using smokeless tobacco. He reports that he does not drink alcohol and does not use drugs.  No Known Allergies  Family History  Problem Relation Age of Onset  . Cancer Mother     Prior to Admission medications   Not on File    Physical Exam: Vitals:   2020-06-25 1151 06-25-2020 1230 06/25/2020 1245 June 25, 2020 1250  BP: (!) 159/73 (!) 127/109 (!) 146/74   Pulse: 72 74 67   Resp: (!) 30 (!) 27 (!) 25   Temp: 97.9 F (36.6 C)   98.9 F (37.2 C)  TempSrc: Oral   Rectal  SpO2: 97% (!) 89% 90%     Constitutional: NAD, calm,, comfortable, on 15 L of oxygen via nonrebreather mask.,  Alert however seems confused  Eyes: PERRL, lids and conjunctivae normal ENMT: Mucous membranes are moist. Posterior pharynx clear of any exudate or lesions.Normal dentition.  Neck: normal, supple, no masses, no thyromegaly Respiratory: clear to auscultation bilaterally, no wheezing, no crackles. Normal respiratory effort. No accessory muscle use.  Cardiovascular: Regular rate and rhythm, no murmurs / rubs / gallops. No extremity edema. 2+ pedal pulses. No carotid bruits.  Abdomen: no tenderness, no masses palpated. No hepatosplenomegaly. Bowel sounds positive.  Musculoskeletal: no clubbing / cyanosis. No joint deformity upper and lower extremities. Good ROM, no contractures. Normal muscle tone.  Skin: no rashes, lesions, ulcers. No induration Neurologic: Alert however appears  confused, moves all extremities equally.   Labs on Admission: I have personally reviewed following labs and imaging studies  CBC: Recent Labs  Lab 06/09/2020 1204 06/15/2020 1210 06/05/2020 1225  WBC 6.1  --   --   NEUTROABS 4.8  --   --   HGB 15.1 14.6 13.6  HCT 47.4 43.0 40.0  MCV 93.5   --   --   PLT 151  --   --    Basic Metabolic Panel: Recent Labs  Lab 06/04/2020 1204 05/24/2020 1210 05/19/2020 1225  NA 140 141 141  K 3.7 3.7 3.6  CL 102  --   --   CO2 23  --   --   GLUCOSE 143*  --   --   BUN 35*  --   --   CREATININE 1.97*  --   --   CALCIUM 8.2*  --   --    GFR: CrCl cannot be calculated (Unknown ideal weight.). Liver Function Tests: Recent Labs  Lab 06/03/2020 1204  AST 101*  ALT 31  ALKPHOS 154*  BILITOT 0.9  PROT 6.3*  ALBUMIN 2.8*   No results for input(s): LIPASE, AMYLASE in the last 168 hours. No results for input(s): AMMONIA in the last 168 hours. Coagulation Profile: No results for input(s): INR, PROTIME in the last 168 hours. Cardiac Enzymes: No results for input(s): CKTOTAL, CKMB, CKMBINDEX, TROPONINI in the last 168 hours. BNP (last 3 results) No results for input(s): PROBNP in the last 8760 hours. HbA1C: No results for input(s): HGBA1C in the last 72 hours. CBG: No results for input(s): GLUCAP in the last 168 hours. Lipid Profile: Recent Labs    05/24/2020 1204  TRIG 140   Thyroid Function Tests: No results for input(s): TSH, T4TOTAL, FREET4, T3FREE, THYROIDAB in the last 72 hours. Anemia Panel: Recent Labs    05/27/2020 1204  FERRITIN 1,482*   Urine analysis:    Component Value Date/Time   COLORURINE YELLOW 08/02/2008 1026   APPEARANCEUR CLEAR 08/02/2008 1026   LABSPEC 1.013 08/02/2008 1026   PHURINE 6.0 08/02/2008 1026   GLUCOSEU NEGATIVE 08/02/2008 1026   HGBUR MODERATE (A) 08/02/2008 1026   BILIRUBINUR NEGATIVE 08/02/2008 1026   KETONESUR NEGATIVE 08/02/2008 1026   PROTEINUR NEGATIVE 08/02/2008 1026   UROBILINOGEN 1.0 08/02/2008 1026   NITRITE NEGATIVE 08/02/2008 1026   LEUKOCYTESUR NEGATIVE 08/02/2008 1026    Radiological Exams on Admission: DG Chest Portable 1 View  Result Date: 06/04/2020 CLINICAL DATA:  Shortness of breath. Family is positive for COVID-19. EXAM: PORTABLE CHEST 1 VIEW COMPARISON:  None.  FINDINGS: Bilateral pulmonary infiltrates are identified. The heart, hila, mediastinum, lungs, and pleura are otherwise unremarkable. IMPRESSION: Bilateral pulmonary infiltrates are identified. Given the history of COVID-19 exposure, the findings could represent COVID-19 pneumonia. Recommend clinical correlation. Electronically Signed   By: Gerome Sam III M.D   On: 05/27/2020 12:42    EKG: Independently reviewed.  Sinus rhythm, probable left atrial enlargement, right axis deviation.  Inferior Q waves in lead III and S wave in aVL.  Assessment/Plan Principal Problem:   Acute hypoxemic respiratory failure due to COVID-19 [facility]) Active Problems:   AKI (acute kidney injury) (HCC)   Elevated troponin   Elevated liver enzymes    Acute hypoxemic respiratory failure due to COVID-19 pneumonia: -Patient is unvaccinated recently exposed to COVID-19 infection at home.  Presented with shortness of breath and hypoxia.  Requiring 15 L of oxygen via nasal cannula.  Afebrile with no leukocytosis, COVID-19 positive,  lactic acid: 5.6, procalcitonin: 1.39, CRP: 19.8.  D-dimer: 1.12. -Patient received Solu-Medrol and remdesivir in ED and IV fluid bolus of 500 cc -Admit patient stepdown unit for close monitoring.  On continuous pulse ox.  We will try to wean off of oxygen as tolerated.  On telemetry. -Continue remdesivir and Solu-Medrol. -Start patient on Rocephin and azithromycin.  Albuterol inhaler, p.o. vitamins, antitussive as needed. -Monitor vitals closely.  Repeat inflammatory markers tomorrow a.m. -I discussed with patient's son about the Barcitinib-however he refused and does not want any non-FDA approved medications given to his dad.  AKI: Likely secondary to dehydration from vomiting -Received IV fluid bolus in ED.  Avoid nephrotoxic medication.  Repeat BMP tomorrow a.m.  Elevated troponin: Initial troponin 117, patient denies ACS symptoms.  Likely demand ischemia due to COVID-19 pneumonia.  Trend  troponin.  Elevated liver enzymes: Likely in the setting of COVID-19 pneumonia.  Repeat CMP tomorrow a.m.  Gout: Hold indomethacin.  Please note: Patient's O2 sats dropped in 80s.  Patient placed on heated high flow 60 L, 100%  however his oxygen saturation remains between 79-82 %.  He is more tachypneic.  We will try BiPAP.  I talked to PCCM Dr. Elwin Sleight will come and assess the patient.  DVT prophylaxis: Lovenox/SCD Code Status: Full code-confirmed with patient's son Family Communication: None present at bedside.  Plan of care discussed with patient in length and he verbalized understanding and agreed with it.  I called patient's son who seems very frustrated as he and his family wanted to see the patient while he is hospitalized.  I offered him face time/video call with the patient however he refused & wanted to see his dad inperson.  Disposition Plan: Home in 4 to 5 days Consults called: None Admission status: Inpatien   Ollen Bowl MD [facility]  If 7PM-7AM, please contact night-coverage [email or web address] Password [email or web address]  06/16/2020, 2:17 PM

## 2020-06-03 DIAGNOSIS — N179 Acute kidney failure, unspecified: Secondary | ICD-10-CM

## 2020-06-03 DIAGNOSIS — J9601 Acute respiratory failure with hypoxia: Secondary | ICD-10-CM | POA: Diagnosis not present

## 2020-06-03 DIAGNOSIS — U071 COVID-19: Secondary | ICD-10-CM | POA: Diagnosis not present

## 2020-06-03 LAB — COMPREHENSIVE METABOLIC PANEL
ALT: 37 U/L (ref 0–44)
AST: 124 U/L — ABNORMAL HIGH (ref 15–41)
Albumin: 2.5 g/dL — ABNORMAL LOW (ref 3.5–5.0)
Alkaline Phosphatase: 126 U/L (ref 38–126)
Anion gap: 11 (ref 5–15)
BUN: 34 mg/dL — ABNORMAL HIGH (ref 8–23)
CO2: 27 mmol/L (ref 22–32)
Calcium: 8 mg/dL — ABNORMAL LOW (ref 8.9–10.3)
Chloride: 105 mmol/L (ref 98–111)
Creatinine, Ser: 1.42 mg/dL — ABNORMAL HIGH (ref 0.61–1.24)
GFR, Estimated: 46 mL/min — ABNORMAL LOW (ref >60)
Glucose, Bld: 167 mg/dL — ABNORMAL HIGH (ref 70–99)
Potassium: 3.9 mmol/L (ref 3.5–5.1)
Sodium: 143 mmol/L (ref 135–145)
Total Bilirubin: 0.9 mg/dL (ref 0.3–1.2)
Total Protein: 5.5 g/dL — ABNORMAL LOW (ref 6.5–8.1)

## 2020-06-03 LAB — POCT I-STAT 7, (LYTES, BLD GAS, ICA,H+H)
Acid-Base Excess: 3 mmol/L — ABNORMAL HIGH (ref 0.0–2.0)
Bicarbonate: 27.8 mmol/L (ref 20.0–28.0)
Calcium, Ion: 1.13 mmol/L — ABNORMAL LOW (ref 1.15–1.40)
HCT: 33 % — ABNORMAL LOW (ref 39.0–52.0)
Hemoglobin: 11.2 g/dL — ABNORMAL LOW (ref 13.0–17.0)
O2 Saturation: 74 %
Potassium: 3.5 mmol/L (ref 3.5–5.1)
Sodium: 146 mmol/L — ABNORMAL HIGH (ref 135–145)
TCO2: 29 mmol/L (ref 22–32)
pCO2 arterial: 41.8 mmHg (ref 32.0–48.0)
pH, Arterial: 7.431 (ref 7.350–7.450)
pO2, Arterial: 38 mmHg — CL (ref 83.0–108.0)

## 2020-06-03 LAB — PHOSPHORUS: Phosphorus: 1.6 mg/dL — ABNORMAL LOW (ref 2.5–4.6)

## 2020-06-03 LAB — CBC WITH DIFFERENTIAL/PLATELET
Abs Immature Granulocytes: 0.06 10*3/uL (ref 0.00–0.07)
Basophils Absolute: 0 10*3/uL (ref 0.0–0.1)
Basophils Relative: 0 %
Eosinophils Absolute: 0 10*3/uL (ref 0.0–0.5)
Eosinophils Relative: 0 %
HCT: 41 % (ref 39.0–52.0)
Hemoglobin: 13.2 g/dL (ref 13.0–17.0)
Immature Granulocytes: 1 %
Lymphocytes Relative: 10 %
Lymphs Abs: 0.6 10*3/uL — ABNORMAL LOW (ref 0.7–4.0)
MCH: 29.9 pg (ref 26.0–34.0)
MCHC: 32.2 g/dL (ref 30.0–36.0)
MCV: 93 fL (ref 80.0–100.0)
Monocytes Absolute: 0.6 10*3/uL (ref 0.1–1.0)
Monocytes Relative: 9 %
Neutro Abs: 5.1 10*3/uL (ref 1.7–7.7)
Neutrophils Relative %: 80 %
Platelets: 143 10*3/uL — ABNORMAL LOW (ref 150–400)
RBC: 4.41 MIL/uL (ref 4.22–5.81)
RDW: 14.5 % (ref 11.5–15.5)
WBC: 6.3 10*3/uL (ref 4.0–10.5)
nRBC: 0 % (ref 0.0–0.2)

## 2020-06-03 LAB — CBG MONITORING, ED
Glucose-Capillary: 180 mg/dL — ABNORMAL HIGH (ref 70–99)
Glucose-Capillary: 150 mg/dL — ABNORMAL HIGH (ref 70–99)

## 2020-06-03 LAB — FERRITIN: Ferritin: 1354 ng/mL — ABNORMAL HIGH (ref 24–336)

## 2020-06-03 LAB — C-REACTIVE PROTEIN: CRP: 15 mg/dL — ABNORMAL HIGH (ref <1.0)

## 2020-06-03 LAB — GLUCOSE, CAPILLARY: Glucose-Capillary: 128 mg/dL — ABNORMAL HIGH (ref 70–99)

## 2020-06-03 LAB — MAGNESIUM: Magnesium: 2 mg/dL (ref 1.7–2.4)

## 2020-06-03 LAB — HEMOGLOBIN A1C
Hgb A1c MFr Bld: 6.2 % — ABNORMAL HIGH (ref 4.8–5.6)
Mean Plasma Glucose: 131 mg/dL

## 2020-06-03 LAB — D-DIMER, QUANTITATIVE: D-Dimer, Quant: 1.41 ug/mL-FEU — ABNORMAL HIGH (ref 0.00–0.50)

## 2020-06-03 MED ORDER — GLYCOPYRROLATE 0.2 MG/ML IJ SOLN: 0.2000 mg | INTRAMUSCULAR | Status: DC | PRN

## 2020-06-03 MED ORDER — INSULIN ASPART 100 UNIT/ML ~~LOC~~ SOLN: 0.0000 [IU] | Freq: Every day | SUBCUTANEOUS | Status: DC

## 2020-06-03 MED ORDER — LORAZEPAM 2 MG/ML IJ SOLN
0.5000 mg | Freq: Once | INTRAMUSCULAR | Status: AC
  Administered 2020-06-03: 0.5 mg via INTRAVENOUS

## 2020-06-03 MED ORDER — MORPHINE 100MG IN NS 100ML (1MG/ML) PREMIX INFUSION: 0.0000 mg/h | INTRAVENOUS | Status: DC

## 2020-06-03 MED ORDER — HALOPERIDOL LACTATE 5 MG/ML IJ SOLN
1.0000 mg | INTRAMUSCULAR | Status: DC | PRN
  Administered 2020-06-03: 1 mg via INTRAVENOUS
  Filled 2020-06-03: qty 1

## 2020-06-03 MED ORDER — MORPHINE SULFATE (PF) 2 MG/ML IV SOLN: 2.0000 mg | INTRAVENOUS | Status: DC | PRN

## 2020-06-03 MED ORDER — ACETAMINOPHEN 650 MG RE SUPP: 650.0000 mg | Freq: Four times a day (QID) | RECTAL | Status: DC | PRN

## 2020-06-03 MED ORDER — DEXTROSE 5 % IV SOLN: INTRAVENOUS | Status: DC

## 2020-06-03 MED ORDER — DEXMEDETOMIDINE HCL IN NACL 400 MCG/100ML IV SOLN
0.2000 ug/kg/h | INTRAVENOUS | Status: DC
  Administered 2020-06-03: 0.4 ug/kg/h via INTRAVENOUS
  Administered 2020-06-04: 0.2 ug/kg/h via INTRAVENOUS
  Administered 2020-06-05: 0.8 ug/kg/h via INTRAVENOUS
  Administered 2020-06-06: 0.7 ug/kg/h via INTRAVENOUS
  Administered 2020-06-06: 0.6 ug/kg/h via INTRAVENOUS
  Administered 2020-06-06: 1 ug/kg/h via INTRAVENOUS
  Filled 2020-06-03 (×7): qty 100

## 2020-06-03 MED ORDER — POLYVINYL ALCOHOL 1.4 % OP SOLN
1.0000 [drp] | Freq: Four times a day (QID) | OPHTHALMIC | Status: DC | PRN
  Filled 2020-06-03: qty 15

## 2020-06-03 MED ORDER — MORPHINE SULFATE (PF) 2 MG/ML IV SOLN
2.0000 mg | INTRAVENOUS | Status: DC | PRN
  Administered 2020-06-03: 2 mg via INTRAVENOUS
  Administered 2020-06-03: 4 mg via INTRAVENOUS
  Filled 2020-06-03: qty 2
  Filled 2020-06-03: qty 1

## 2020-06-03 MED ORDER — LORAZEPAM 2 MG/ML IJ SOLN
INTRAMUSCULAR | Status: AC
  Filled 2020-06-03: qty 1

## 2020-06-03 MED ORDER — ACETAMINOPHEN 325 MG PO TABS: 650.0000 mg | ORAL_TABLET | Freq: Four times a day (QID) | ORAL | Status: DC | PRN

## 2020-06-03 MED ORDER — GLYCOPYRROLATE 1 MG PO TABS
1.0000 mg | ORAL_TABLET | ORAL | Status: DC | PRN
  Filled 2020-06-03: qty 1

## 2020-06-03 MED ORDER — MORPHINE BOLUS VIA INFUSION
5.0000 mg | INTRAVENOUS | Status: DC | PRN
  Filled 2020-06-03: qty 5

## 2020-06-03 MED ORDER — INSULIN ASPART 100 UNIT/ML ~~LOC~~ SOLN
0.0000 [IU] | Freq: Three times a day (TID) | SUBCUTANEOUS | Status: DC
  Administered 2020-06-03: 3 [IU] via SUBCUTANEOUS
  Administered 2020-06-03: 2 [IU] via SUBCUTANEOUS
  Administered 2020-06-04 – 2020-06-06 (×7): 3 [IU] via SUBCUTANEOUS
  Administered 2020-06-06 – 2020-06-07 (×3): 2 [IU] via SUBCUTANEOUS
  Administered 2020-06-07: 3 [IU] via SUBCUTANEOUS
  Administered 2020-06-08: 2 [IU] via SUBCUTANEOUS

## 2020-06-03 MED ORDER — DIPHENHYDRAMINE HCL 50 MG/ML IJ SOLN: 25.0000 mg | INTRAMUSCULAR | Status: DC | PRN

## 2020-06-03 NOTE — Progress Notes (Signed)
[facility] P[facility]M  Patient seen and examined again with the patient's family in the ER.  We discussed the patient's poor prognosis based on the rapid progression of his ARDS and the fact that he is suffering on the ventilator right now.  They voiced understanding.  I recommended full comfort measures in no uncertain terms.    There was significant conflict among the family members with several arguments between the brother complete with shouting and swearing.  The patient's wife has dementia, yet clearly stated that she did not want her husband to suffer and agreed with morphine and treatment to make him comfortable.  However the son's did not agree with this recommendations and noted that because of his wife's dementia they believed that they needed to try to get an answer from the patient himself regarding his well being.    They agreed that he should not be intubated or receive CPR. They want him to be admitted to the ICU for further treatment with BIPAP and medical support.  I explained he is suffering on BIPAP and we need to provide morphine to relieve suffering, they agree to this approach.  They are hoping that he will miraculously recover over the next 24 hours, though I told them that what is most likely is that he will worsen and will require a morphine infusion.  Additional cc time 1 hour  Heber Goodrich, MD [place] [facility] Pager: [phone] Cell: [phone] If no response, call [phone]

## 2020-06-03 NOTE — Progress Notes (Addendum)
RT called to pt's room due to pt being very agitated and not leaving Bipap mask.  Plan is to switch pt to Navos and see if he tolerates it. Pt placed on  HHFNC 55L/100% & NRB.  Sp02 80-84%.  Waiting on MD to call back.

## 2020-06-03 NOTE — Progress Notes (Addendum)
eLink Physician-Brief Progress Note Patient Name: [name] DOB: June 03, 1936 MRN: [id number]   Date of Service  06/03/2020  HPI/Events of Note  precedex is up at 0.8 and patient resting now.  He is 80-83% on the HHFN and NRB.   Is that acceptable?? we can restrain him while he is on HHFNC but not on BiPap.   Camera: sats 84%, calmer now. Discussed with RT.  eICU Interventions  - get ABG and call back.   - NP Lelon Mast made aware that he is in ICU from ED.      Intervention Category Intermediate Interventions: Respiratory distress - evaluation and management  Ranee Gosselin 06/03/2020, 11:25 PM

## 2020-06-03 NOTE — Progress Notes (Addendum)
eLink Physician-Brief Progress Note Patient Name: [name] DOB: 27-May-1936 MRN: [id number]   Date of Service  06/03/2020  HPI/Events of Note  Agitation, confusion constatly pulling BiPAP mask.  Hypercarbic.  Haldol did not work.  Camera eval: Team holding his hands.  sats 82%, HR 92.   Discussed below plan of action.   eICU Interventions  Trial of low dose ativan 0.5 mg, Precedex gt low dose as tolerated, to watch for brady and low BP. - Ok to go off of BiPAP and try NRB mask or high flow o2. Keep sats 88 to 94 %. - soft bilateral wrist restraints ordered     Intervention Category Major Interventions: Delirium, psychosis, severe agitation - evaluation and management  Ranee Gosselin 06/03/2020, 10:27 PM

## 2020-06-03 NOTE — Progress Notes (Signed)
eLink Physician-Brief Progress Note Patient Name: [name] DOB: 1936-01-18 MRN: [id number]   Date of Service  06/03/2020  HPI/Events of Note  84 yr old male Diagnosed Covid + from 29 th, worsening AHRF, needing BiPAP. Dr Kendrick Fries notes regarding code status and controversy reviewed.shifted to ICU for rapidly declining respiratory status.   Camera: Constantly touching BiPAP mask. Confused. HR 67. 96% stas on 20/10/20/100%. 146/66.  Data reviewed.  Covid Pneumonia/AHRF-ARDS AKI Elevated troponin and LFT Metabolic encephaloapthy  eICU Interventions  - continue current care, antivirals. - aspiration precautions - prn ABG - on steroids higher dose, on ssi.  Goal Bg < 180 - asked RN to clarify Code status with Dr Kendrick Fries , his notes reviewed. - Haldol IV once.  HR < 65, so avoiding precedex gtt for now. - on Lovenox as VTE. - follow cytokine markers, labs in AM.       Intervention Category Intermediate Interventions: Respiratory distress - evaluation and management Evaluation Type: New Patient Evaluation  Ranee Gosselin 06/03/2020, 9:13 PM

## 2020-06-03 NOTE — ED Notes (Signed)
Pt stood up on side of bed to use urinal.  Sats remained 100% on bipap.  RT attempted to wean pt on 60%, but pt could not tolerate.  Bi-pap removed for pt to receive oral meds. Sats decreased to 75%, but quickly recovered after bi-pap re-placed.

## 2020-06-03 NOTE — Progress Notes (Addendum)
Spoke with E-link MD and he wants  an ABG.  ABG drawn and critical results results called to E-link RN.  Waiting on MD to call back.

## 2020-06-03 NOTE — Progress Notes (Signed)
Patient transported to 3M06 without complications.  

## 2020-06-03 NOTE — Progress Notes (Signed)
NAME:  [name], MRN:  [id number], DOB:  1935-10-09, LOS: 1 ADMISSION DATE:  09-Jun-2020, CONSULTATION DATE:  10/17 REFERRING MD:  Jacqulyn Bath, CHIEF COMPLAINT:  Dyspnea   Brief History   84 y/o male admitted with COVID pneumonia requiring high levels of O2 support in the ER.  Past Medical History  Gout  Significant Hospital Events   10/17 admission, later in the evening started on NIMV for worsening hypoxemia  Consults:    Procedures:    Significant Diagnostic Tests:    Micro Data:  SARSCoV2 10/17 >> positive Flu A/B 10/17 >> negative  Antimicrobials:  Remdesivir 10/17 x 1 Azithromycin 10/17 >>  Ceftriaxone 10/17 >>   Solu-Medrol 10/17 >> Baricitinib 10/17 >>   Interim history/subjective:  He says that he feels comfortable on BIPAP but he has some mild dyspnea Nursing reports that when they attempted HHF overnight he would desaturate He has not had anything to eat He was able to briefly come off BIPAP overnight to take cough medicine and he had significant desaturation during that time    Objective   Blood pressure 134/84, pulse (!) 59, temperature 98.7 F (37.1 C), temperature source Axillary, resp. rate (!) 34, weight 75 kg, SpO2 95 %.    Vent Mode: BIPAP;PCV FiO2 (%):  [80 %-100 %] 80 % Set Rate:  [20 bmp] 20 bmp PEEP:  [8 cmH20] 8 cmH20   Intake/Output Summary (Last 24 hours) at 06/03/2020 0757 Last data filed at 06-09-20 2125 Gross per 24 hour  Intake 896.75 ml  Output --  Net 896.75 ml   Filed Weights   Jun 09, 2020 1400  Weight: 75 kg    Examination:  General:  Elderly male, acutely ill appearing resting in bed on BIPAP HENT: NCAT OP clear NIMV in place PULM: Coarse crackles in bases bilaterally, no accessory muscle use CV: RRR, no mgr GI: BS+, soft, nontender MSK: normal bulk and tone Neuro: drowsy but will wake up and follow commands, move all four extremities  10/17 CXR images personally reviewed showing dense airspace disease  bilaterally  Resolved Hospital Problem list     Assessment & Plan:  ARDS due to COVID 19 pneumonia> has been worsening in hospital I agree with Dr. Delton Coombes that his prognosis is poor given his advanced age, severity of illness, and multi-organ failure > would attempt to transition to heated high flow to allow him to eat, don't worry about O2 desaturation as long as he is maintaining normal mental status and doesn't have increased work of breathing > continue solumedrol 2mg /kg IV q12h > continue barcitinib > disontinue ceftriaxone and azithro  > continue remdesivir > Tolerate periods of hypoxemia, goal at rest is greater than 85% SaO2, with movement ideally above 75% > Decision for mechanical ventilatory support (NIMV) should be based on a change in mental status or physical evidence of ventilatory failure such as nasal flaring, accessory muscle use, paradoxical breathing > Out of bed to chair as able > Incentive spirometry is important, use every hour > Prone positioning while in bed  AKI > improving Monitor BMET and UOP Replace electrolytes as needed Try to advance diet  Hyperglycemia in setting of steroid use SSI  Elevated LFT > likely remdesivir related monitor  Demand ischemia Tele Supportive care  Toxic metabolic hypoxemic encephalopathy Supportive care Frequent orientation Minimize sedating medications  His prognosis is poor. If he his unable to be liberated from NIMV long enough to eat a meal then we need to move forward with bringing  in his family to see him and comfort measures.   I explained this to the patient's son and he voiced understanding.  Best practice:  Diet: regular diet Pain/Anxiety/Delirium protocol (if indicated): n/a VAP protocol (if indicated): n/a DVT prophylaxis: lovenox GI prophylaxis: n/a Glucose control: as above Mobility: bed rest Code Status: partial code, no intubation Family Communication: I updated the patient's son, please see  above Disposition: to ICU, priority #1  Labs   CBC: Recent Labs  Lab 06/09/2020 1204 06/09/2020 1210 05/27/2020 1225 06/03/20 0406  WBC 6.1  --   --  6.3  NEUTROABS 4.8  --   --  5.1  HGB 15.1 14.6 13.6 13.2  HCT 47.4 43.0 40.0 41.0  MCV 93.5  --   --  93.0  PLT 151  --   --  143*    Basic Metabolic Panel: Recent Labs  Lab 05/21/2020 1204 05/20/2020 1210 05/28/2020 1225 06/03/20 0406  NA 140 141 141 143  K 3.7 3.7 3.6 3.9  CL 102  --   --  105  CO2 23  --   --  27  GLUCOSE 143*  --   --  167*  BUN 35*  --   --  34*  CREATININE 1.97*  --   --  1.42*  CALCIUM 8.2*  --   --  8.0*  MG  --   --   --  2.0   GFR: CrCl cannot be calculated (Unknown ideal weight.). Recent Labs  Lab 06/15/2020 1204 06/13/2020 1422 06/03/20 0406  PROCALCITON 1.39  --   --   WBC 6.1  --  6.3  LATICACIDVEN 5.6* 3.2*  --     Liver Function Tests: Recent Labs  Lab 05/19/2020 1204 06/03/20 0406  AST 101* 124*  ALT 31 37  ALKPHOS 154* 126  BILITOT 0.9 0.9  PROT 6.3* 5.5*  ALBUMIN 2.8* 2.5*   No results for input(s): LIPASE, AMYLASE in the last 168 hours. No results for input(s): AMMONIA in the last 168 hours.  ABG    Component Value Date/Time   PHART 7.399 05/20/2020 1225   PCO2ART 39.2 06/01/2020 1225   PO2ART 50 (L) 06/12/2020 1225   HCO3 24.4 06/01/2020 1225   TCO2 26 06/01/2020 1225   O2SAT 86.0 05/20/2020 1225     Coagulation Profile: No results for input(s): INR, PROTIME in the last 168 hours.  Cardiac Enzymes: No results for input(s): CKTOTAL, CKMB, CKMBINDEX, TROPONINI in the last 168 hours.  HbA1C: No results found for: HGBA1C  CBG: No results for input(s): GLUCAP in the last 168 hours.   Critical care time: 35 minutes    Heber [place], MD [place] [facility] Pager: [phone] Cell: [phone] If no response, call [phone]

## 2020-06-03 NOTE — ED Notes (Signed)
Lunch Tray Ordered @ 1050. 

## 2020-06-03 NOTE — Progress Notes (Signed)
[facility]  Patient seen again in ER We attempted to switch him to heated high flow oxygen, he quickly developed respiratory muscle fatigue and required BIPAP within 10 minutes He is resting on BIPAP, but overall his condition has rapidly deteriorated since yesterday. I explained to the patient's son that he will not survive this condition and we need to make him comfortable.  He voiced understanding We plan to admit him to a med-surg bed, bring him on BIPAP until his family can see him then withdraw BIPAP and make him comfortable.  His family voiced understanding.  Heber [place], MD [place] [facility] Pager: [phone] Cell: [phone] If no response, call [phone]

## 2020-06-03 NOTE — ED Notes (Signed)
MD requests pt be moved from BI-PAP To Hi Flow.  RT notified.

## 2020-06-03 NOTE — Progress Notes (Signed)
eLink Physician-Brief Progress Note Patient Name: [name] DOB: [birth year] MRN: [id number]   Date of Service  06/03/2020  HPI/Events of Note  aBG . P/F 48.    eICU Interventions  Discussed with NP Samantha. She will stop by to assess him. - will try BiPAP back on since he is calmer now. - DNR.      Intervention Category Major Interventions: Acid-Base disturbance - evaluation and management  Ranee Gosselin 06/03/2020, 11:46 PM

## 2020-06-03 NOTE — Progress Notes (Signed)
Critical Care Progress Note:  I evaluated Mr. Lipinski in the ED. He was initially admitted to the Hospitalist service, however, Dr. Delton Coombes with PCCM subsequently saw the patient and recommended transfer to the ICU. On my exam, he is on BiPAP at 80% FiO2 at 18/8 with oxygen saturation in the mid to upper 90s. He remains DNR at this time, although his son is adamant that he should receive invasive mechanical ventilation if necessary. Order placed for transfer to ICU when bed becomes available. I encouraged staff to allow patient to transition to Optiflow if desired. SpO2 goal >88%. Continue current measures for Covid including baricitinib (if taking PO), steroids, antibioitics.  Marcelo Baldy, MD 06/03/20 2:37 AM

## 2020-06-03 NOTE — ED Notes (Signed)
CCM paged 

## 2020-06-03 NOTE — Progress Notes (Signed)
PHARMACY - PHYSICIAN COMMUNICATION CRITICAL VALUE ALERT - BLOOD CULTURE IDENTIFICATION (BCID)  [name] is an 84 y.o. male who presented to [facility] on 05/26/2020 with a chief complaint of SOB  Assessment:  1/4 BC with gpr, no BCID, likely contaminant.  COVID positive  Name of physician (or Provider) Contacted:   Current antibiotics: azithromycin and rocephin  Changes to prescribed antibiotics recommended:  none  No results found for this or any previous visit.  [name] 06/03/2020  1:58 AM

## 2020-06-04 DIAGNOSIS — U071 COVID-19: Secondary | ICD-10-CM | POA: Diagnosis not present

## 2020-06-04 LAB — CBC WITH DIFFERENTIAL/PLATELET
Abs Immature Granulocytes: 0.13 10*3/uL — ABNORMAL HIGH (ref 0.00–0.07)
Basophils Absolute: 0 10*3/uL (ref 0.0–0.1)
Basophils Relative: 0 %
Eosinophils Absolute: 0 10*3/uL (ref 0.0–0.5)
Eosinophils Relative: 0 %
HCT: 37.1 % — ABNORMAL LOW (ref 39.0–52.0)
Hemoglobin: 11.9 g/dL — ABNORMAL LOW (ref 13.0–17.0)
Immature Granulocytes: 1 %
Lymphocytes Relative: 8 %
Lymphs Abs: 0.8 10*3/uL (ref 0.7–4.0)
MCH: 30 pg (ref 26.0–34.0)
MCHC: 32.1 g/dL (ref 30.0–36.0)
MCV: 93.5 fL (ref 80.0–100.0)
Monocytes Absolute: 0.8 10*3/uL (ref 0.1–1.0)
Monocytes Relative: 8 %
Neutro Abs: 8 10*3/uL — ABNORMAL HIGH (ref 1.7–7.7)
Neutrophils Relative %: 83 %
Platelets: 168 10*3/uL (ref 150–400)
RBC: 3.97 MIL/uL — ABNORMAL LOW (ref 4.22–5.81)
RDW: 14.6 % (ref 11.5–15.5)
WBC: 9.7 10*3/uL (ref 4.0–10.5)
nRBC: 0.2 % (ref 0.0–0.2)

## 2020-06-04 LAB — GLUCOSE, CAPILLARY
Glucose-Capillary: 131 mg/dL — ABNORMAL HIGH (ref 70–99)
Glucose-Capillary: 156 mg/dL — ABNORMAL HIGH (ref 70–99)
Glucose-Capillary: 157 mg/dL — ABNORMAL HIGH (ref 70–99)
Glucose-Capillary: 167 mg/dL — ABNORMAL HIGH (ref 70–99)
Glucose-Capillary: 169 mg/dL — ABNORMAL HIGH (ref 70–99)
Glucose-Capillary: 173 mg/dL — ABNORMAL HIGH (ref 70–99)
Glucose-Capillary: 197 mg/dL — ABNORMAL HIGH (ref 70–99)

## 2020-06-04 LAB — CULTURE, BLOOD (ROUTINE X 2)

## 2020-06-04 LAB — COMPREHENSIVE METABOLIC PANEL
ALT: 47 U/L — ABNORMAL HIGH (ref 0–44)
AST: 138 U/L — ABNORMAL HIGH (ref 15–41)
Albumin: 2.5 g/dL — ABNORMAL LOW (ref 3.5–5.0)
Alkaline Phosphatase: 117 U/L (ref 38–126)
Anion gap: 11 (ref 5–15)
BUN: 45 mg/dL — ABNORMAL HIGH (ref 8–23)
CO2: 27 mmol/L (ref 22–32)
Calcium: 8.1 mg/dL — ABNORMAL LOW (ref 8.9–10.3)
Chloride: 106 mmol/L (ref 98–111)
Creatinine, Ser: 1.51 mg/dL — ABNORMAL HIGH (ref 0.61–1.24)
GFR, Estimated: 42 mL/min — ABNORMAL LOW (ref >60)
Glucose, Bld: 168 mg/dL — ABNORMAL HIGH (ref 70–99)
Potassium: 3.9 mmol/L (ref 3.5–5.1)
Sodium: 144 mmol/L (ref 135–145)
Total Bilirubin: 0.5 mg/dL (ref 0.3–1.2)
Total Protein: 5.1 g/dL — ABNORMAL LOW (ref 6.5–8.1)

## 2020-06-04 LAB — C-REACTIVE PROTEIN: CRP: 7.7 mg/dL — ABNORMAL HIGH (ref <1.0)

## 2020-06-04 LAB — MRSA PCR SCREENING: MRSA by PCR: NEGATIVE

## 2020-06-04 LAB — PHOSPHORUS: Phosphorus: 1.9 mg/dL — ABNORMAL LOW (ref 2.5–4.6)

## 2020-06-04 LAB — MAGNESIUM: Magnesium: 2.4 mg/dL (ref 1.7–2.4)

## 2020-06-04 LAB — FERRITIN: Ferritin: 1011 ng/mL — ABNORMAL HIGH (ref 24–336)

## 2020-06-04 MED ORDER — LORAZEPAM 2 MG/ML IJ SOLN
2.0000 mg | Freq: Once | INTRAMUSCULAR | Status: AC
  Administered 2020-06-04: 2 mg via INTRAVENOUS

## 2020-06-04 MED ORDER — SODIUM PHOSPHATES 45 MMOLE/15ML IV SOLN
30.0000 mmol | Freq: Once | INTRAVENOUS | Status: AC
  Administered 2020-06-04: 30 mmol via INTRAVENOUS
  Filled 2020-06-04: qty 10

## 2020-06-04 MED ORDER — CHLORHEXIDINE GLUCONATE 0.12 % MT SOLN
15.0000 mL | Freq: Two times a day (BID) | OROMUCOSAL | Status: DC
  Administered 2020-06-04 – 2020-06-07 (×8): 15 mL via OROMUCOSAL

## 2020-06-04 MED ORDER — HALOPERIDOL LACTATE 5 MG/ML IJ SOLN
4.0000 mg | Freq: Four times a day (QID) | INTRAMUSCULAR | Status: DC
  Administered 2020-06-04 – 2020-06-07 (×13): 4 mg via INTRAVENOUS
  Filled 2020-06-04 (×13): qty 1

## 2020-06-04 MED ORDER — ORAL CARE MOUTH RINSE
15.0000 mL | Freq: Two times a day (BID) | OROMUCOSAL | Status: DC
  Administered 2020-06-04 – 2020-06-07 (×9): 15 mL via OROMUCOSAL

## 2020-06-04 MED ORDER — MORPHINE SULFATE (PF) 2 MG/ML IV SOLN
2.0000 mg | INTRAVENOUS | Status: DC | PRN
  Administered 2020-06-04 (×2): 2 mg via INTRAVENOUS
  Filled 2020-06-04 (×2): qty 1

## 2020-06-04 MED ORDER — CHLORHEXIDINE GLUCONATE CLOTH 2 % EX PADS
6.0000 | MEDICATED_PAD | Freq: Every day | CUTANEOUS | Status: DC
  Administered 2020-06-04 – 2020-06-07 (×4): 6 via TOPICAL

## 2020-06-04 MED ORDER — LORAZEPAM 2 MG/ML IJ SOLN
INTRAMUSCULAR | Status: AC
  Filled 2020-06-04: qty 1

## 2020-06-04 MED ORDER — MORPHINE BOLUS VIA INFUSION
2.0000 mg | INTRAVENOUS | Status: DC | PRN
  Administered 2020-06-04 – 2020-06-07 (×3): 2 mg via INTRAVENOUS
  Filled 2020-06-04: qty 2

## 2020-06-04 MED ORDER — ENOXAPARIN SODIUM 40 MG/0.4ML ~~LOC~~ SOLN
40.0000 mg | SUBCUTANEOUS | Status: DC
  Administered 2020-06-04 – 2020-06-07 (×4): 40 mg via SUBCUTANEOUS
  Filled 2020-06-04 (×4): qty 0.4

## 2020-06-04 MED ORDER — MORPHINE 100MG IN NS 100ML (1MG/ML) PREMIX INFUSION
0.0000 mg/h | INTRAVENOUS | Status: DC
  Administered 2020-06-04: 1 mg/h via INTRAVENOUS
  Administered 2020-06-05 – 2020-06-08 (×2): 2 mg/h via INTRAVENOUS
  Filled 2020-06-04 (×3): qty 100

## 2020-06-04 MED ORDER — HALOPERIDOL LACTATE 5 MG/ML IJ SOLN
2.0000 mg | INTRAMUSCULAR | Status: AC | PRN
  Administered 2020-06-04 – 2020-06-05 (×4): 2 mg via INTRAVENOUS
  Filled 2020-06-04 (×4): qty 1

## 2020-06-04 NOTE — Progress Notes (Signed)
Pt started on precedex and is moore calm at this time. MD wants Pt taken off HHFNC and placed back on Bipap.  Pt tolerating well.

## 2020-06-04 NOTE — Progress Notes (Signed)
NAME:  [name], MRN:  [id number], DOB:  April 09, 1936, LOS: 2 ADMISSION DATE:  2020-06-17, CONSULTATION DATE:  10/17 REFERRING MD:  Jacqulyn Bath, CHIEF COMPLAINT:  Dyspnea   Brief History   84 y/o male admitted with COVID pneumonia requiring high levels of O2 support in the ER. Initially progressing towards comfort care however some family members have opted for BiPAP but no intubation.  Past Medical History  Gout  Significant Hospital Events   10/17 admission, later in the evening started on NIMV for worsening hypoxemia  Consults:  None  Procedures:  None  Significant Diagnostic Tests:  Chest x-ray  Micro Data:  SARSCoV2 10/17 >> positive Flu A/B 10/17 >> negative  Antimicrobials:  Remdesivir 10/17 x 1 Azithromycin 10/17 >>  Ceftriaxone 10/17 >>   Solu-Medrol 10/17 >> Baricitinib 10/17 >>   Interim history/subjective:  Now in ICU on BiPAP.  Remains very agitated confused and combative.  Will settle momentarily with dexmedetomidine infusion and bolus haloperidol and morphine.  Objective   Blood pressure 109/87, pulse (!) 43, temperature 97.7 F (36.5 C), temperature source Axillary, resp. rate 19, height 5\' 10"  (1.778 m), weight 88.5 kg, SpO2 (!) 89 %.    Vent Mode: CPAP FiO2 (%):  [100 %] 100 % Set Rate:  [20 bmp] 20 bmp PEEP:  [10 cmH20] 10 cmH20   Intake/Output Summary (Last 24 hours) at 06/04/2020 1016 Last data filed at 06/04/2020 0900 Gross per 24 hour  Intake 2510.83 ml  Output 400 ml  Net 2110.83 ml   Filed Weights   06-17-2020 1400 06/03/20 2003  Weight: 75 kg 88.5 kg    Examination:  General:  Elderly male, acutely ill appearing resting in bed on BIPAP agitated try to remove restraints. HENT: NCAT OP clear NIMV in place PULM: Coarse crackles in bases bilaterally, no accessory muscle use CV: RRR, no mgr GI: BS+, soft, nontender MSK: normal bulk and tone Neuro: Agitated not following commands, move all four extremities  10/17 CXR images  personally reviewed showing dense airspace disease bilaterally  Resolved Hospital Problem list     Assessment & Plan:   ARDS due to COVID 19 pneumonia> has been worsening in hospital Agree with Dr. 11/17 and Dr. Delton Coombes that his prognosis is poor given his advanced age, severity of illness, and multi-organ failure -Continue BiPAP/heated high flow oxygen and titrate to comfort.  Largely ignore oxygen saturation as patient remains DNR.Tolerate periods of hypoxemia, goal at rest is greater than 85% SaO2, with movement ideally above 75% -Initiate low-dose morphine infusion to control air hunger and agitation as these are likely to contribute to more rapid decline.  Patient may actually do better with better palliation.  COVID-19 pneumonia > Taper Solu-Medrol as may be causing steroid induced psychosis > continue barcitinib > disontinue ceftriaxone and azithro  > continue remdesivir  Acute encephalopathy due to delirium from COVID-19 and hypoxia.  Currently his major challenge. -Continue dexmedetomidine -Continue as needed haloperidol we will load and set maintenance dose. -Low-dose morphine infusion.  AKI > improving Monitor BMET and UOP Replace electrolytes as needed Try to advance diet  Hyperglycemia in setting of steroid use SSI  Elevated LFT > likely remdesivir related monitor  Demand ischemia Tele Supportive care   Best practice:  Diet: regular diet Pain/Anxiety/Delirium protocol (if indicated): n/a VAP protocol (if indicated): n/a DVT prophylaxis: lovenox GI prophylaxis: n/a Glucose control: as above Mobility: bed rest Code Status: partial code, no intubation Family Communication: Family updated by Dr. Kendrick Fries yesterday.  Disposition: to ICU,   Labs   CBC: Recent Labs  Lab Jun 11, 2020 1204 06-11-20 1204 Jun 11, 2020 1210 06/11/2020 1225 06/03/20 0406 06/03/20 2333 06/04/20 0403  WBC 6.1  --   --   --  6.3  --  9.7  NEUTROABS 4.8  --   --   --  5.1  --  8.0*    HGB 15.1   < > 14.6 13.6 13.2 11.2* 11.9*  HCT 47.4   < > 43.0 40.0 41.0 33.0* 37.1*  MCV 93.5  --   --   --  93.0  --  93.5  PLT 151  --   --   --  143*  --  168   < > = values in this interval not displayed.    Basic Metabolic Panel: Recent Labs  Lab 06-11-2020 1204 11-Jun-2020 1204 2020/06/11 1210 06-11-20 1225 06/03/20 0406 06/03/20 2333 06/04/20 0403  NA 140   < > 141 141 143 146* 144  K 3.7   < > 3.7 3.6 3.9 3.5 3.9  CL 102  --   --   --  105  --  106  CO2 23  --   --   --  27  --  27  GLUCOSE 143*  --   --   --  167*  --  168*  BUN 35*  --   --   --  34*  --  45*  CREATININE 1.97*  --   --   --  1.42*  --  1.51*  CALCIUM 8.2*  --   --   --  8.0*  --  8.1*  MG  --   --   --   --  2.0  --  2.4  PHOS  --   --   --   --  1.6*  --  1.9*   < > = values in this interval not displayed.   GFR: Estimated Creatinine Clearance: 42.3 mL/min (A) (by C-G formula based on SCr of 1.51 mg/dL (H)). Recent Labs  Lab 06/11/20 1204 06-11-2020 1422 06/03/20 0406 06/04/20 0403  PROCALCITON 1.39  --   --   --   WBC 6.1  --  6.3 9.7  LATICACIDVEN 5.6* 3.2*  --   --     Liver Function Tests: Recent Labs  Lab 06-11-2020 1204 06/03/20 0406 06/04/20 0403  AST 101* 124* 138*  ALT 31 37 47*  ALKPHOS 154* 126 117  BILITOT 0.9 0.9 0.5  PROT 6.3* 5.5* 5.1*  ALBUMIN 2.8* 2.5* 2.5*   No results for input(s): LIPASE, AMYLASE in the last 168 hours. No results for input(s): AMMONIA in the last 168 hours.  ABG    Component Value Date/Time   PHART 7.431 06/03/2020 2333   PCO2ART 41.8 06/03/2020 2333   PO2ART 38 (LL) 06/03/2020 2333   HCO3 27.8 06/03/2020 2333   TCO2 29 06/03/2020 2333   O2SAT 74.0 06/03/2020 2333     Coagulation Profile: No results for input(s): INR, PROTIME in the last 168 hours.  Cardiac Enzymes: No results for input(s): CKTOTAL, CKMB, CKMBINDEX, TROPONINI in the last 168 hours.  HbA1C: Hgb A1c MFr Bld  Date/Time Value Ref Range Status  06/03/2020 04:06 AM 6.2 (H)  4.8 - 5.6 % Final    Comment:    (NOTE)         Prediabetes: 5.7 - 6.4         Diabetes: >6.4  Glycemic control for adults with diabetes: <7.0     CBG: Recent Labs  Lab 06/03/20 1215 06/03/20 1718 06/03/20 2203 06/04/20 0728  GLUCAP 180* 150* 128* 167*   CRITICAL CARE Performed by: Lynnell Catalan   Total critical care time: 35 minutes  Critical care time was exclusive of separately billable procedures and treating other patients.  Critical care was necessary to treat or prevent imminent or life-threatening deterioration.  Critical care was time spent personally by me on the following activities: development of treatment plan with patient and/or surrogate as well as nursing, discussions with consultants, evaluation of patient's response to treatment, examination of patient, obtaining history from patient or surrogate, ordering and performing treatments and interventions, ordering and review of laboratory studies, ordering and review of radiographic studies, pulse oximetry, re-evaluation of patient's condition and participation in multidisciplinary rounds.  Lynnell Catalan, MD [facility] ICU Physician [facility] Critical [facility]  Pager: [phone] Mobile: [phone] After hours: [phone].

## 2020-06-04 NOTE — Progress Notes (Signed)
Spoke with Reita Cliche (son) and Wife on phone and they expressed they wanted to take their father/husband out of the hospital today and go home. RN reminded family that a palliative care consult has been placed and will attempt to get a meeting scheduled tomorrow as soon as possible. They seemed to be okay with this offer and will wait for a call from palliative tomorrow. Tamsen Meek, RN 06/04/2020 6:44 PM

## 2020-06-05 DIAGNOSIS — J9601 Acute respiratory failure with hypoxia: Secondary | ICD-10-CM | POA: Diagnosis not present

## 2020-06-05 DIAGNOSIS — U071 COVID-19: Secondary | ICD-10-CM

## 2020-06-05 DIAGNOSIS — Z515 Encounter for palliative care: Secondary | ICD-10-CM | POA: Diagnosis not present

## 2020-06-05 DIAGNOSIS — R748 Abnormal levels of other serum enzymes: Secondary | ICD-10-CM

## 2020-06-05 DIAGNOSIS — J80 Acute respiratory distress syndrome: Secondary | ICD-10-CM

## 2020-06-05 DIAGNOSIS — Z7189 Other specified counseling: Secondary | ICD-10-CM

## 2020-06-05 LAB — CBC WITH DIFFERENTIAL/PLATELET
Abs Immature Granulocytes: 0.19 10*3/uL — ABNORMAL HIGH (ref 0.00–0.07)
Basophils Absolute: 0 10*3/uL (ref 0.0–0.1)
Basophils Relative: 0 %
Eosinophils Absolute: 0 10*3/uL (ref 0.0–0.5)
Eosinophils Relative: 0 %
HCT: 38.6 % — ABNORMAL LOW (ref 39.0–52.0)
Hemoglobin: 12.4 g/dL — ABNORMAL LOW (ref 13.0–17.0)
Immature Granulocytes: 2 %
Lymphocytes Relative: 5 %
Lymphs Abs: 0.5 10*3/uL — ABNORMAL LOW (ref 0.7–4.0)
MCH: 30.1 pg (ref 26.0–34.0)
MCHC: 32.1 g/dL (ref 30.0–36.0)
MCV: 93.7 fL (ref 80.0–100.0)
Monocytes Absolute: 0.8 10*3/uL (ref 0.1–1.0)
Monocytes Relative: 7 %
Neutro Abs: 9.6 10*3/uL — ABNORMAL HIGH (ref 1.7–7.7)
Neutrophils Relative %: 86 %
Platelets: 168 10*3/uL (ref 150–400)
RBC: 4.12 MIL/uL — ABNORMAL LOW (ref 4.22–5.81)
RDW: 14.8 % (ref 11.5–15.5)
WBC: 11.1 10*3/uL — ABNORMAL HIGH (ref 4.0–10.5)
nRBC: 0.3 % — ABNORMAL HIGH (ref 0.0–0.2)

## 2020-06-05 LAB — COMPREHENSIVE METABOLIC PANEL
ALT: 47 U/L — ABNORMAL HIGH (ref 0–44)
AST: 109 U/L — ABNORMAL HIGH (ref 15–41)
Albumin: 2.3 g/dL — ABNORMAL LOW (ref 3.5–5.0)
Alkaline Phosphatase: 106 U/L (ref 38–126)
Anion gap: 10 (ref 5–15)
BUN: 56 mg/dL — ABNORMAL HIGH (ref 8–23)
CO2: 27 mmol/L (ref 22–32)
Calcium: 8 mg/dL — ABNORMAL LOW (ref 8.9–10.3)
Chloride: 111 mmol/L (ref 98–111)
Creatinine, Ser: 1.49 mg/dL — ABNORMAL HIGH (ref 0.61–1.24)
GFR, Estimated: 43 mL/min — ABNORMAL LOW (ref >60)
Glucose, Bld: 158 mg/dL — ABNORMAL HIGH (ref 70–99)
Potassium: 4 mmol/L (ref 3.5–5.1)
Sodium: 148 mmol/L — ABNORMAL HIGH (ref 135–145)
Total Bilirubin: 0.6 mg/dL (ref 0.3–1.2)
Total Protein: 5.1 g/dL — ABNORMAL LOW (ref 6.5–8.1)

## 2020-06-05 LAB — D-DIMER, QUANTITATIVE: D-Dimer, Quant: 13.43 ug/mL-FEU — ABNORMAL HIGH (ref 0.00–0.50)

## 2020-06-05 LAB — GLUCOSE, CAPILLARY
Glucose-Capillary: 128 mg/dL — ABNORMAL HIGH (ref 70–99)
Glucose-Capillary: 140 mg/dL — ABNORMAL HIGH (ref 70–99)
Glucose-Capillary: 153 mg/dL — ABNORMAL HIGH (ref 70–99)
Glucose-Capillary: 159 mg/dL — ABNORMAL HIGH (ref 70–99)
Glucose-Capillary: 171 mg/dL — ABNORMAL HIGH (ref 70–99)
Glucose-Capillary: 173 mg/dL — ABNORMAL HIGH (ref 70–99)

## 2020-06-05 LAB — PHOSPHORUS: Phosphorus: 3.6 mg/dL (ref 2.5–4.6)

## 2020-06-05 LAB — C-REACTIVE PROTEIN: CRP: 3.5 mg/dL — ABNORMAL HIGH (ref <1.0)

## 2020-06-05 LAB — MAGNESIUM: Magnesium: 2.7 mg/dL — ABNORMAL HIGH (ref 1.7–2.4)

## 2020-06-05 LAB — FERRITIN: Ferritin: 686 ng/mL — ABNORMAL HIGH (ref 24–336)

## 2020-06-05 MED ORDER — SODIUM CHLORIDE 0.9 % IV SOLN
1.0000 g | INTRAVENOUS | Status: DC
  Administered 2020-06-05 – 2020-06-07 (×3): 1 g via INTRAVENOUS
  Filled 2020-06-05 (×3): qty 10

## 2020-06-05 MED ORDER — SODIUM CHLORIDE 0.9 % IV SOLN
500.0000 mg | INTRAVENOUS | Status: DC
  Administered 2020-06-05 – 2020-06-07 (×3): 500 mg via INTRAVENOUS
  Filled 2020-06-05 (×5): qty 500

## 2020-06-05 MED ORDER — CHOLECALCIFEROL 10 MCG/ML (400 UNIT/ML) PO LIQD: 400.0000 [IU] | Freq: Every day | ORAL | Status: DC

## 2020-06-05 MED ORDER — THIAMINE HCL 100 MG/ML IJ SOLN
Freq: Once | INTRAVENOUS | Status: AC
  Filled 2020-06-05: qty 1000

## 2020-06-05 MED ORDER — CHOLECALCIFEROL 10 MCG/ML (400 UNIT/ML) PO LIQD
400.0000 [IU] | Freq: Every day | ORAL | Status: DC
  Filled 2020-06-05: qty 1

## 2020-06-05 MED ORDER — ASCORBIC ACID 500 MG PO TABS: 500.0000 mg | ORAL_TABLET | Freq: Every day | ORAL | Status: DC

## 2020-06-05 MED ORDER — CHOLECALCIFEROL 10 MCG (400 UNIT) PO TABS
400.0000 [IU] | ORAL_TABLET | Freq: Every day | ORAL | Status: DC
  Filled 2020-06-05 (×3): qty 1

## 2020-06-05 MED ORDER — ASCORBIC ACID 500 MG/ML IJ SOLN
500.0000 mg | Freq: Every day | INTRAMUSCULAR | Status: DC
  Filled 2020-06-05: qty 1

## 2020-06-05 NOTE — Progress Notes (Signed)
NAME:  [name], MRN:  [id number], DOB:  03/21/1936, LOS: 3 ADMISSION DATE:  06/07/2020, CONSULTATION DATE:  10/17 REFERRING MD:  Jacqulyn Bath, CHIEF COMPLAINT:  Dyspnea   Brief History   84 y/o male admitted with COVID pneumonia requiring high levels of O2 support in the ER. Initially progressing towards comfort care however some family members have opted for BiPAP but no intubation.  Past Medical History  Gout  Significant Hospital Events   10/17 admission, later in the evening started on NIMV for worsening hypoxemia  Consults:  None  Procedures:  None  Significant Diagnostic Tests:  Chest x-ray  Micro Data:  SARSCoV2 10/17 >> positive Flu A/B 10/17 >> negative  Antimicrobials:  Remdesivir 10/17 x 1 Azithromycin 10/17 >>  Ceftriaxone 10/17 >>   Solu-Medrol 10/17 >> Baricitinib 10/17 >>   Interim history/subjective:   Continues on BiPAP.  Saturations remained in the 80s.  Comfortable but will become agitated with minimal stimulation.  Objective   Blood pressure (!) 127/59, pulse (!) 58, temperature 98 F (36.7 C), temperature source Axillary, resp. rate (!) 22, height 5\' 10"  (1.778 m), weight 88.5 kg, SpO2 (!) 87 %.    Vent Mode: BIPAP;PCV FiO2 (%):  [60 %-100 %] 60 % Set Rate:  [20 bmp] 20 bmp PEEP:  [5 cmH20-8 cmH20] 8 cmH20   Intake/Output Summary (Last 24 hours) at 06/05/2020 1400 Last data filed at 06/05/2020 1200 Gross per 24 hour  Intake 1431.46 ml  Output 1422 ml  Net 9.46 ml   Filed Weights   06/10/2020 1400 06/03/20 2003  Weight: 75 kg 88.5 kg    Examination:  General:  Elderly male, acutely ill appearing resting in bed on BIPAP agitated try to remove restraints. HENT: NCAT OP clear NIMV in place PULM: Coarse crackles in bases bilaterally, no accessory muscle use CV: RRR, no mgr GI: BS+, soft, nontender MSK: normal bulk and tone Neuro: Agitated not following commands, move all four extremities    Resolved Hospital Problem list      Assessment & Plan:   ARDS due to COVID 19 pneumonia> has been worsening in hospital Agree with Dr. 06/05/20 and Dr. Delton Coombes that his prognosis is poor given his advanced age, severity of illness, and multi-organ failure -Continue BiPAP/heated high flow oxygen and titrate to comfort.  Largely ignore oxygen saturation as patient remains DNR.Tolerate periods of hypoxemia, goal at rest is greater than 85% SaO2, with movement ideally above 75% -Initiate low-dose morphine infusion to control air hunger and agitation as these are likely to contribute to more rapid decline.  Patient may actually do better with better palliation.  COVID-19 pneumonia > Taper Solu-Medrol as may be causing steroid induced psychosis > continue barcitinib > continue remdesivir > At family's request, will start IV vitamin therapy, restart azithromycin.  Establish enteral access for hydroxychloroquine.  Acute encephalopathy due to delirium from COVID-19 and hypoxia.  Currently his major challenge. -Continue dexmedetomidine -Continue as needed haloperidol we will load and set maintenance dose. -Low-dose morphine infusion.  AKI > improving Monitor BMET and UOP Replace electrolytes as needed Try to advance diet  Hyperglycemia in setting of steroid use SSI  Elevated LFT > likely remdesivir related monitor  Demand ischemia Tele Supportive care   Best practice:  Diet: Core track tube Pain/Anxiety/Delirium protocol (if indicated): n/a VAP protocol (if indicated): n/a DVT prophylaxis: lovenox GI prophylaxis: n/a Glucose control: as above Mobility: bed rest Code Status: partial code, no intubation Family Communication: Conversation with family today with  palliative care present.  Expressed concern that nothing was being done for the patient.  Explained to them that patient was receiving appropriate care based on best evidence.  However they are insistent on starting unproven therapies as a last ditch attempt.   Have started those that are least likely to cause harm.  Still made clear to them that his condition may still worsen even in spite of this. Disposition: to ICU.   Labs   CBC: Recent Labs  Lab 05/21/2020 1204 06/03/2020 1210 05/27/2020 1225 06/03/20 0406 06/03/20 2333 06/04/20 0403 06/05/20 0157  WBC 6.1  --   --  6.3  --  9.7 11.1*  NEUTROABS 4.8  --   --  5.1  --  8.0* 9.6*  HGB 15.1   < > 13.6 13.2 11.2* 11.9* 12.4*  HCT 47.4   < > 40.0 41.0 33.0* 37.1* 38.6*  MCV 93.5  --   --  93.0  --  93.5 93.7  PLT 151  --   --  143*  --  168 168   < > = values in this interval not displayed.    Basic Metabolic Panel: Recent Labs  Lab 06/15/2020 1204 05/21/2020 1210 06/09/2020 1225 06/03/20 0406 06/03/20 2333 06/04/20 0403 06/05/20 0157  NA 140   < > 141 143 146* 144 148*  K 3.7   < > 3.6 3.9 3.5 3.9 4.0  CL 102  --   --  105  --  106 111  CO2 23  --   --  27  --  27 27  GLUCOSE 143*  --   --  167*  --  168* 158*  BUN 35*  --   --  34*  --  45* 56*  CREATININE 1.97*  --   --  1.42*  --  1.51* 1.49*  CALCIUM 8.2*  --   --  8.0*  --  8.1* 8.0*  MG  --   --   --  2.0  --  2.4 2.7*  PHOS  --   --   --  1.6*  --  1.9* 3.6   < > = values in this interval not displayed.   GFR: Estimated Creatinine Clearance: 42.8 mL/min (A) (by C-G formula based on SCr of 1.49 mg/dL (H)). Recent Labs  Lab 05/23/2020 1204 05/18/2020 1422 06/03/20 0406 06/04/20 0403 06/05/20 0157  PROCALCITON 1.39  --   --   --   --   WBC 6.1  --  6.3 9.7 11.1*  LATICACIDVEN 5.6* 3.2*  --   --   --     Liver Function Tests: Recent Labs  Lab 05/22/2020 1204 06/03/20 0406 06/04/20 0403 06/05/20 0157  AST 101* 124* 138* 109*  ALT 31 37 47* 47*  ALKPHOS 154* 126 117 106  BILITOT 0.9 0.9 0.5 0.6  PROT 6.3* 5.5* 5.1* 5.1*  ALBUMIN 2.8* 2.5* 2.5* 2.3*   No results for input(s): LIPASE, AMYLASE in the last 168 hours. No results for input(s): AMMONIA in the last 168 hours.  ABG    Component Value Date/Time   PHART  7.431 06/03/2020 2333   PCO2ART 41.8 06/03/2020 2333   PO2ART 38 (LL) 06/03/2020 2333   HCO3 27.8 06/03/2020 2333   TCO2 29 06/03/2020 2333   O2SAT 74.0 06/03/2020 2333     Coagulation Profile: No results for input(s): INR, PROTIME in the last 168 hours.  Cardiac Enzymes: No results for input(s): CKTOTAL, CKMB, CKMBINDEX, TROPONINI in the last  168 hours.  HbA1C: Hgb A1c MFr Bld  Date/Time Value Ref Range Status  06/03/2020 04:06 AM 6.2 (H) 4.8 - 5.6 % Final    Comment:    (NOTE)         Prediabetes: 5.7 - 6.4         Diabetes: >6.4         Glycemic control for adults with diabetes: <7.0     CBG: Recent Labs  Lab 06/04/20 2132 06/04/20 2331 06/05/20 0338 06/05/20 0753 06/05/20 1110  GLUCAP 131* 156* 153* 159* 173*   CRITICAL CARE Performed by: Lynnell Catalan   Total critical care time: 35 minutes  Critical care time was exclusive of separately billable procedures and treating other patients.  Critical care was necessary to treat or prevent imminent or life-threatening deterioration.  Critical care was time spent personally by me on the following activities: development of treatment plan with patient and/or surrogate as well as nursing, discussions with consultants, evaluation of patient's response to treatment, examination of patient, obtaining history from patient or surrogate, ordering and performing treatments and interventions, ordering and review of laboratory studies, ordering and review of radiographic studies, pulse oximetry, re-evaluation of patient's condition and participation in multidisciplinary rounds.  Lynnell Catalan, MD [facility] ICU Physician [facility] Critical Care  Pager: [phone] Mobile: [phone] After hours: [phone].

## 2020-06-05 NOTE — Consult Note (Addendum)
Consultation Note Date: 06/05/2020   Patient Name: [name]  DOB: 09/20/1935  MRN: [id number]  Age / Sex: 84 y.o., male  PCP: Patient, No Pcp Per Referring Physician: Kipp Brood, MD  Reason for Consultation: Establishing goals of care  HPI/Patient Profile: 84 y.o. male  with past medical history of gout, knee replacement, gastric ulcers, L eye vision loss, admitted on 05/23/2020 with Covid-19 induced respiratory failure. Per discussion with pulmonologist in the ED with family- decision made for no intubation. Patient requiring bipap. Attempts to decrease O2 requirements and transition to heated high flow have been made but patient rapidly drops oxygen saturation. He is hyponatremic, BUN and Cr are increased, hypoalbuminemic, WBC were normal on presentation but slightly elevated today (patient has been receiving steroids), transaminases elevated. CRP is trending down. D-Dimer elevated today at 13.43. Palliative medicine consulted for goals of care.      Clinical Assessment and Goals of Care: Per chart review-   1. Decision made to continue patient on Bipap and utilize morphine for comfort with providers and family when patient was in the Emergency Room  2. Plan was to attempt to wean patient from bipap and if unable- then transition patient off of bipap and utilize comfort measures only and allow for natural death with dignity and support and comfort measures  3. However, thereafter there was some conflict and decision was made to continue bipap and medical treatment for Covid with limits set at no intubation  4. Family also requested that patient NOT receive Remdesivir or Baricitinib  I met with patient's three sons- Jamesetta So, Cash, and patient's wife- Denice Paradise.  Meeting was held outside of the hospital due to reports that patient's spouse was positive for Covid. After further discussion- appears Denice Paradise was  positive for Covid over 2 weeks ago- was symptomatic and felt poorly, but is feeling better and recovering now.  All of patient's sons deny Covid symptoms and have not been tested for Covid.  Apolonio Schneiders, RN was also present. Dr. Lynetta Mare was present for part of the meeting as well.   Prior to admission Kartel was active- enjoyed playing golf, mowing his lawn. He and Denice Paradise have been married for 53 years.   Family was understandably upset regarding hospital visiting restrictions. [facility] visitation policy for Covid patients can be found on [facility] website at [email or web address] under "visitor policies" and is as follows:   "All visitors must pass entrance screening, wear a mask and other appropriate personal protective equipment, and wash hands entering and exiting a patient room.   Gaiters do not meet [place]'s masking requirements and therefore are not allowed. An appropriate mask will be provided, if needed.  No visitors for COVID-19 Positive patients unless the attending physician has determined the patient is at end of life.  COVID-19 Positive Patients at End of Life Three visitors at a time may be in the patient room and may switch out with other visitors.  The physician, unit leadership and the nurse will coordinate length of visitation with the family.  Visitation may occur outside of normal visiting hours and visitors must comply with the COVID-19 positive visitation requirements.  This always includes wearing a mask, not eating or drinking in the patient room, not standing in hallways or patient care areas and not going to other hospital areas, except the entrance/exit."  Family expressed concerns regarding patient's plan of care.  Patient's son's Cash's wife is a retinal tech and requests that patient receive Vitamin C, Zinc, Azithromycin, and ivermectin. Cash discussed this with Dr. Lynetta Mare.   Discussion with family was emotional, and at times volatile, although they would apologize at times  for their outbursts.   Conversation was also difficult due to distrust in the health system and providers.   I believe at the close of the discussion decisions were made to:   1. Continue bipap  2. No intubation  3. Trial IV zinc, vit c, and antibiotics  4. If after 48 hours there is not clear improvement in patient's status, then will again discuss what transition to full comfort measures looks like.   5. Link to set up Mychart sent to patient's son, Mortimer Fries.   6. Family will contact nursing unit to set up e-link video visit  SUMMARY OF RECOMMENDATIONS -See above discussion   Code Status/Advance Care Planning:  DNR  Discharge Planning: To Be Determined  Primary Diagnoses: Present on Admission: . Acute hypoxemic respiratory failure due to COVID-19 ([place]) . AKI (acute kidney injury) ([place]) . Elevated troponin . Elevated liver enzymes . COVID-19 . COVID . Acute respiratory distress syndrome (ARDS) due to COVID-19 virus [facility])   I have reviewed the medical record, interviewed the patient and family, and examined the patient. The following aspects are pertinent.  Past Medical History:  Diagnosis Date  . Gout    Social History   Socioeconomic History  . Marital status: Married    Spouse name: Not on file  . Number of children: Not on file  . Years of education: Not on file  . Highest education level: Not on file  Occupational History  . Not on file  Tobacco Use  . Smoking status: Former Research scientist (life sciences)  . Smokeless tobacco: Former Network engineer and Sexual Activity  . Alcohol use: Never  . Drug use: Never  . Sexual activity: Not Currently  Other Topics Concern  . Not on file  Social History Narrative  . Not on file   Social Determinants of Health   Financial Resource Strain:   . Difficulty of Paying Living Expenses: Not on file  Food Insecurity:   . Worried About Charity fundraiser in the Last Year: Not on file  . Ran Out of Food in the Last Year: Not on file    Transportation Needs:   . Lack of Transportation (Medical): Not on file  . Lack of Transportation (Non-Medical): Not on file  Physical Activity:   . Days of Exercise per Week: Not on file  . Minutes of Exercise per Session: Not on file  Stress:   . Feeling of Stress : Not on file  Social Connections:   . Frequency of Communication with Friends and Family: Not on file  . Frequency of Social Gatherings with Friends and Family: Not on file  . Attends Religious Services: Not on file  . Active Member of Clubs or Organizations: Not on file  . Attends Archivist Meetings: Not on file  . Marital Status: Not on file   Scheduled Meds: . albuterol  2 puff Inhalation  Q6H  . vitamin C  500 mg Oral Daily  . ascorbic acid  500 mg Intravenous Daily  . baricitinib  2 mg Oral Daily  . chlorhexidine  15 mL Mouth Rinse BID  . Chlorhexidine Gluconate Cloth  6 each Topical Daily  . cholecalciferol  400 Units Oral Daily  . enoxaparin (LOVENOX) injection  40 mg Subcutaneous Q24H  . haloperidol lactate  4 mg Intravenous Q6H  . insulin aspart  0-15 Units Subcutaneous TID WC  . insulin aspart  0-5 Units Subcutaneous QHS  . mouth rinse  15 mL Mouth Rinse q12n4p  . methylPREDNISolone (SOLU-MEDROL) injection  1 mg/kg Intravenous Q12H   Followed by  . predniSONE  50 mg Oral Daily  . zinc sulfate  220 mg Oral Daily   Continuous Infusions: . azithromycin    . cefTRIAXone (ROCEPHIN)  IV    . dexmedetomidine (PRECEDEX) IV infusion Stopped (06/05/20 1019)  . famotidine (PEPCID) IV Stopped (06/04/20 1732)  . lactated ringers 50 mL/hr at 06/05/20 1200  . morphine 2 mg/hr (06/05/20 1200)  . banana bag IV 1000 mL     PRN Meds:.chlorpheniramine-HYDROcodone, guaiFENesin-dextromethorphan, haloperidol lactate, morphine, ondansetron **OR** ondansetron (ZOFRAN) IV Medications Prior to Admission:  Prior to Admission medications   Medication Sig Start Date End Date Taking? Authorizing Provider   indomethacin (INDOCIN) 50 MG capsule Take 50 mg by mouth 2 (two) times daily with a meal. 05/25/20  Yes [provider]  omeprazole (PRILOSEC OTC) 20 MG tablet Take 20 mg by mouth daily as needed (heartburn).   Yes [provider]   No Known Allergies Review of Systems  Unable to perform ROS: Acuity of condition    Physical Exam Vitals and nursing note reviewed.  Constitutional:      Appearance: He is ill-appearing.  Neurological:     Mental Status: He is disoriented.     Vital Signs: BP (!) 127/59 (BP Location: Left Arm)   Pulse (!) 58   Temp 98 F (36.7 C) (Axillary)   Resp (!) 22   Ht _0  (1.778 m)   Wt 88.5 kg   SpO2 (!) 87%   BMI 27.99 kg/m  Pain Scale: Faces   Pain Score: 0-No pain   SpO2: SpO2: (!) 87 % O2 Device:SpO2: (!) 87 % O2 Flow Rate: .O2 Flow Rate (L/min): 50 L/min  IO: Intake/output summary:   Intake/Output Summary (Last 24 hours) at 06/05/2020 1331 Last data filed at 06/05/2020 1200 Gross per 24 hour  Intake 1532.22 ml  Output 1422 ml  Net 110.22 ml    LBM: Last BM Date:  (PTA) Baseline Weight: Weight: 75 kg Most recent weight: Weight: 88.5 kg     Palliative Assessment/Data: PPS: 10%     Thank you for this consult. Palliative medicine will continue to follow and assist as needed.   Time In: 1130 Time Out: 1330 Time Total: 120 mins Greater than 50%  of this time was spent counseling and coordinating care related to the above assessment and plan.  Signed by: Mariana Kaufman, AGNP-C Palliative Medicine    Please contact Palliative Medicine Team phone at [phone] for questions and concerns.  For individual provider: See Shea Evans

## 2020-06-05 NOTE — Progress Notes (Signed)
Cortrak Tube Team Note:  Consult received to place a Cortrak feeding tube.   Attempted to place Cortrak feeding tube but placement was unsuccessful. Can consider placement of small-bore feeding tube under fluoroscopic guidance. Cortrak feeding tube and bridle left in patient's room taped on wall.  Felix Pacini, MS, RD, LDN Pager number available on Amion

## 2020-06-05 NOTE — Progress Notes (Signed)
Video call with wife, Alvino Chapel and son, Reita Cliche. Patient unresponsive, on BiPap

## 2020-06-05 NOTE — Progress Notes (Signed)
Initial Nutrition Assessment  DOCUMENTATION CODES:   Not applicable  INTERVENTION:   Diet advancement as able  If unable to advance diet consider small bore feeding tube placement under fluro Vital 1.5 @ 45 ml/hr 90 ml ProSource TF TID  Provides: 1860 kcal, 138 grams protein, and 822 ml free water.    NUTRITION DIAGNOSIS:   Increased nutrient needs related to catabolic illness (COVID-19 PNA) as evidenced by estimated needs.  GOAL:   Patient will meet greater than or equal to 90% of their needs  MONITOR:   Diet advancement, I & O's  REASON FOR ASSESSMENT:   NPO/Clear Liquid Diet    ASSESSMENT:   Pt with PMH of gout admitted 10/17 with COVID-19+ PNA.   Pt unable to answer any questions at this time. Pt currently on BiPAP support.  Spoke with pt's RN. Pt remains a DNR at this time.   10/20 unsuccessful cortrak tube placement. Pt very agitated with attempts despite medications and staff support unable to place tube safely.    Medications reviewed and include: vitamin C 500 mg daily, Vitamin D3 400 units daily, decadron, novolog SSI, zinc  Precedex Morphine  100 mg thiamine, 1 mg folic acid, MVI x 1 via IV 44/81 LR @ 75 ml/hr   Labs reviewed: Na 158, CRP 1.6 CBG's: [phone]    NUTRITION - FOCUSED PHYSICAL EXAM:    Most Recent Value  Orbital Region No depletion  Upper Arm Region No depletion  Thoracic and Lumbar Region No depletion  Buccal Region Unable to assess  Temple Region No depletion  Clavicle Bone Region Mild depletion  Clavicle and Acromion Bone Region Mild depletion  Scapular Bone Region Unable to assess  Dorsal Hand No depletion  Patellar Region No depletion  Anterior Thigh Region No depletion  Posterior Calf Region No depletion  Edema (RD Assessment) None  Hair Reviewed  Eyes Unable to assess  Mouth Unable to assess  Skin Unable to assess  Nails Unable to assess       Diet Order:   Diet Order            Diet NPO time specified   Diet effective now                 EDUCATION NEEDS:   No education needs have been identified at this time  Skin:  Skin Assessment: Reviewed RN Assessment  Last BM:  unknown  Height:   Ht Readings from Last 1 Encounters:  06/03/20 5\' 10"  (1.778 m)    Weight:   Wt Readings from Last 1 Encounters:  06/03/20 88.5 kg    Ideal Body Weight:  75.4 kg  BMI:  Body mass index is 27.99 kg/m.  Estimated Nutritional Needs:   Kcal:  1700-2300  Protein:  132-150 grams  Fluid:  2 L/day  06/05/20., RD, LDN, CNSC See AMiON for contact information

## 2020-06-06 ENCOUNTER — Inpatient Hospital Stay (HOSPITAL_COMMUNITY): Payer: Medicare HMO

## 2020-06-06 DIAGNOSIS — R7989 Other specified abnormal findings of blood chemistry: Secondary | ICD-10-CM

## 2020-06-06 DIAGNOSIS — U071 COVID-19: Secondary | ICD-10-CM | POA: Diagnosis not present

## 2020-06-06 LAB — GLUCOSE, CAPILLARY
Glucose-Capillary: 126 mg/dL — ABNORMAL HIGH (ref 70–99)
Glucose-Capillary: 138 mg/dL — ABNORMAL HIGH (ref 70–99)
Glucose-Capillary: 143 mg/dL — ABNORMAL HIGH (ref 70–99)
Glucose-Capillary: 149 mg/dL — ABNORMAL HIGH (ref 70–99)
Glucose-Capillary: 149 mg/dL — ABNORMAL HIGH (ref 70–99)
Glucose-Capillary: 152 mg/dL — ABNORMAL HIGH (ref 70–99)
Glucose-Capillary: 158 mg/dL — ABNORMAL HIGH (ref 70–99)

## 2020-06-06 LAB — CBC WITH DIFFERENTIAL/PLATELET
Abs Immature Granulocytes: 0.3 10*3/uL — ABNORMAL HIGH (ref 0.00–0.07)
Basophils Absolute: 0 10*3/uL (ref 0.0–0.1)
Basophils Relative: 0 %
Eosinophils Absolute: 0 10*3/uL (ref 0.0–0.5)
Eosinophils Relative: 0 %
HCT: 39.2 % (ref 39.0–52.0)
Hemoglobin: 12.5 g/dL — ABNORMAL LOW (ref 13.0–17.0)
Immature Granulocytes: 3 %
Lymphocytes Relative: 5 %
Lymphs Abs: 0.6 10*3/uL — ABNORMAL LOW (ref 0.7–4.0)
MCH: 30.6 pg (ref 26.0–34.0)
MCHC: 31.9 g/dL (ref 30.0–36.0)
MCV: 95.8 fL (ref 80.0–100.0)
Monocytes Absolute: 1 10*3/uL (ref 0.1–1.0)
Monocytes Relative: 8 %
Neutro Abs: 9.4 10*3/uL — ABNORMAL HIGH (ref 1.7–7.7)
Neutrophils Relative %: 84 %
Platelets: 165 10*3/uL (ref 150–400)
RBC: 4.09 MIL/uL — ABNORMAL LOW (ref 4.22–5.81)
RDW: 14.6 % (ref 11.5–15.5)
WBC: 11.3 10*3/uL — ABNORMAL HIGH (ref 4.0–10.5)
nRBC: 0.4 % — ABNORMAL HIGH (ref 0.0–0.2)

## 2020-06-06 LAB — COMPREHENSIVE METABOLIC PANEL
ALT: 44 U/L (ref 0–44)
AST: 73 U/L — ABNORMAL HIGH (ref 15–41)
Albumin: 2.2 g/dL — ABNORMAL LOW (ref 3.5–5.0)
Alkaline Phosphatase: 104 U/L (ref 38–126)
Anion gap: 7 (ref 5–15)
BUN: 55 mg/dL — ABNORMAL HIGH (ref 8–23)
CO2: 28 mmol/L (ref 22–32)
Calcium: 7.8 mg/dL — ABNORMAL LOW (ref 8.9–10.3)
Chloride: 118 mmol/L — ABNORMAL HIGH (ref 98–111)
Creatinine, Ser: 1.36 mg/dL — ABNORMAL HIGH (ref 0.61–1.24)
GFR, Estimated: 48 mL/min — ABNORMAL LOW (ref >60)
Glucose, Bld: 167 mg/dL — ABNORMAL HIGH (ref 70–99)
Potassium: 4.2 mmol/L (ref 3.5–5.1)
Sodium: 153 mmol/L — ABNORMAL HIGH (ref 135–145)
Total Bilirubin: 1 mg/dL (ref 0.3–1.2)
Total Protein: 4.7 g/dL — ABNORMAL LOW (ref 6.5–8.1)

## 2020-06-06 LAB — C-REACTIVE PROTEIN: CRP: 1.6 mg/dL — ABNORMAL HIGH (ref <1.0)

## 2020-06-06 LAB — MAGNESIUM: Magnesium: 2.7 mg/dL — ABNORMAL HIGH (ref 1.7–2.4)

## 2020-06-06 LAB — D-DIMER, QUANTITATIVE: D-Dimer, Quant: >20 ug/mL-FEU — ABNORMAL HIGH (ref 0.00–0.50)

## 2020-06-06 LAB — FERRITIN: Ferritin: 521 ng/mL — ABNORMAL HIGH (ref 24–336)

## 2020-06-06 LAB — PHOSPHORUS: Phosphorus: 3.2 mg/dL (ref 2.5–4.6)

## 2020-06-06 MED ORDER — DEXAMETHASONE SODIUM PHOSPHATE 10 MG/ML IJ SOLN
6.0000 mg | INTRAMUSCULAR | Status: DC
  Administered 2020-06-06 – 2020-06-07 (×2): 6 mg via INTRAVENOUS
  Filled 2020-06-06 (×2): qty 1

## 2020-06-06 NOTE — Progress Notes (Signed)
NAME:  [name], MRN:  [id number], DOB:  1935/09/12, LOS: 4 ADMISSION DATE:  2020/06/04, CONSULTATION DATE:  10/17 REFERRING MD:  Jacqulyn Bath, CHIEF COMPLAINT:  Dyspnea   Brief History   84 y/o male admitted with COVID pneumonia requiring high levels of O2 support in the ER. Initially progressing towards comfort care however some family members have opted for BiPAP but no intubation.  Past Medical History  Gout  Significant Hospital Events   10/17 admission, later in the evening started on NIMV for worsening hypoxemia  Consults:  None  Procedures:  None  Significant Diagnostic Tests:  Chest x-ray  Micro Data:  SARSCoV2 10/17 >> positive Flu A/B 10/17 >> negative  Antimicrobials:  Remdesivir 10/17 x 1 Azithromycin 10/17 >>  Ceftriaxone 10/17 >>   Solu-Medrol 10/17 >> Baricitinib 10/17 >>   Interim history/subjective:   Continues on BiPAP.  Have increased to the 90s on BiPAP while sedated.  Comfortable but will become agitated with minimal stimulation with associated desaturation.  Unable to place small bore feeding tube without significant agitation with desaturation.  Objective   Blood pressure (!) 152/70, pulse (!) 44, temperature 98.7 F (37.1 C), temperature source Axillary, resp. rate 11, height 5\' 10"  (1.778 m), weight 88.5 kg, SpO2 95 %.    Vent Mode: PCV;BIPAP FiO2 (%):  [60 %] 60 % Set Rate:  [20 bmp] 20 bmp PEEP:  [8 cmH20] 8 cmH20   Intake/Output Summary (Last 24 hours) at 06/06/2020 1050 Last data filed at 06/06/2020 1000 Gross per 24 hour  Intake 2133.85 ml  Output 560 ml  Net 1573.85 ml   Filed Weights   06/04/2020 1400 06/03/20 2003  Weight: 75 kg 88.5 kg    Examination:  General:  Elderly male, acutely ill appearing resting in bed on BIPAP agitated try to remove restraints. HENT: NCAT OP clear NIMV in place PULM: Coarse crackles in bases bilaterally, no accessory muscle use CV: RRR, no mgr GI: BS+, soft, nontender MSK: normal bulk and  tone Neuro: Agitated not following commands, move all four extremities with full strength.  Resolved Hospital Problem list     Assessment & Plan:   ARDS due to COVID 19 pneumonia> has been worsening in hospital Agree with Dr. 06/05/20 and Dr. Delton Coombes that his prognosis is poor given his advanced age, severity of illness, and multi-organ failure -Continue BiPAP/heated high flow oxygen and titrate to comfort.  Largely ignore oxygen saturation as patient remains DNR.Tolerate periods of hypoxemia, goal at rest is greater than 85% SaO2, with movement ideally above 75% -Continue low-dose morphine infusion to control air hunger and agitation as these are likely to contribute to more rapid decline.  Patient may actually do better with better palliation.  COVID-19 pneumonia > Taper Solu-Medrol as may be causing steroid induced psychosis > continue barcitinib > continue remdesivir > At family's request, will start IV vitamin therapy, restart azithromycin.   Acute encephalopathy due to delirium from COVID-19 and hypoxia.  Currently his major challenge. -Continue dexmedetomidine -Continue as needed haloperidol we will load and set maintenance dose. -Low-dose morphine infusion.  AKI > improving -Monitor BMET and UOP -Replace electrolytes as needed -Continue maintenance fluids  Hyperglycemia in setting of steroid use SSI.-Blood sugar well controlled.  Elevated LFT > likely remdesivir related -Continue to monitor appear to be stable.  Demand ischemia Tele Supportive care  Markedly elevated D-dimer -Screening duplex Doppler of the legs to rule out DVT.  Best practice:  Diet: Core track tube Pain/Anxiety/Delirium protocol (if  indicated): n/a VAP protocol (if indicated): n/a DVT prophylaxis: lovenox GI prophylaxis: n/a Glucose control: as above Mobility: bed rest Code Status: partial code, no intubation Family Communication: updated [name] today informed him that the patient's  oxygenation had stabilized, and that his inflammatory markers had improved with the treatment initiated on admission.  Unfortunately we have been unable to establish enteral access and that he still remains confused.  We agreed that the patient's condition will play itself out over the next 4 days or so and that over this time, we will probably have some clarity as to which direction he will go.  [name] also confirmed that there are indeed limits to what his father would wish for in terms of care and that he would not want prolonged interventions or being stuck on life support. Disposition: to ICU.   Labs   CBC: Recent Labs  Lab June 26, 2020 1204 2020-06-26 1210 06/03/20 0406 06/03/20 2333 06/04/20 0403 06/05/20 0157 06/06/20 0447  WBC 6.1  --  6.3  --  9.7 11.1* 11.3*  NEUTROABS 4.8  --  5.1  --  8.0* 9.6* 9.4*  HGB 15.1   < > 13.2 11.2* 11.9* 12.4* 12.5*  HCT 47.4   < > 41.0 33.0* 37.1* 38.6* 39.2  MCV 93.5  --  93.0  --  93.5 93.7 95.8  PLT 151  --  143*  --  168 168 165   < > = values in this interval not displayed.    Basic Metabolic Panel: Recent Labs  Lab 06-26-20 1204 06/26/2020 1210 06/03/20 0406 06/03/20 2333 06/04/20 0403 06/05/20 0157 06/06/20 0447  NA 140   < > 143 146* 144 148* 153*  K 3.7   < > 3.9 3.5 3.9 4.0 4.2  CL 102  --  105  --  106 111 118*  CO2 23  --  27  --  27 27 28   GLUCOSE 143*  --  167*  --  168* 158* 167*  BUN 35*  --  34*  --  45* 56* 55*  CREATININE 1.97*  --  1.42*  --  1.51* 1.49* 1.36*  CALCIUM 8.2*  --  8.0*  --  8.1* 8.0* 7.8*  MG  --   --  2.0  --  2.4 2.7* 2.7*  PHOS  --   --  1.6*  --  1.9* 3.6 3.2   < > = values in this interval not displayed.   GFR: Estimated Creatinine Clearance: 46.9 mL/min (A) (by C-G formula based on SCr of 1.36 mg/dL (H)). Recent Labs  Lab 2020-06-26 1204 June 26, 2020 1204 June 26, 2020 1422 06/03/20 0406 06/04/20 0403 06/05/20 0157 06/06/20 0447  PROCALCITON 1.39  --   --   --   --   --   --   WBC 6.1   < >  --   6.3 9.7 11.1* 11.3*  LATICACIDVEN 5.6*  --  3.2*  --   --   --   --    < > = values in this interval not displayed.    Liver Function Tests: Recent Labs  Lab Jun 26, 2020 1204 06/03/20 0406 06/04/20 0403 06/05/20 0157 06/06/20 0447  AST 101* 124* 138* 109* 73*  ALT 31 37 47* 47* 44  ALKPHOS 154* 126 117 106 104  BILITOT 0.9 0.9 0.5 0.6 1.0  PROT 6.3* 5.5* 5.1* 5.1* 4.7*  ALBUMIN 2.8* 2.5* 2.5* 2.3* 2.2*   No results for input(s): LIPASE, AMYLASE in the last 168 hours. No results  for input(s): AMMONIA in the last 168 hours.  ABG    Component Value Date/Time   PHART 7.431 06/03/2020 2333   PCO2ART 41.8 06/03/2020 2333   PO2ART 38 (LL) 06/03/2020 2333   HCO3 27.8 06/03/2020 2333   TCO2 29 06/03/2020 2333   O2SAT 74.0 06/03/2020 2333     Coagulation Profile: No results for input(s): INR, PROTIME in the last 168 hours.  Cardiac Enzymes: No results for input(s): CKTOTAL, CKMB, CKMBINDEX, TROPONINI in the last 168 hours.  HbA1C: Hgb A1c MFr Bld  Date/Time Value Ref Range Status  06/03/2020 04:06 AM 6.2 (H) 4.8 - 5.6 % Final    Comment:    (NOTE)         Prediabetes: 5.7 - 6.4         Diabetes: >6.4         Glycemic control for adults with diabetes: <7.0     CBG: Recent Labs  Lab 06/05/20 1629 06/05/20 1959 06/05/20 2330 06/06/20 0402 06/06/20 0730  GLUCAP 171* 128* 140* 149* 149*   CRITICAL CARE Performed by: Lynnell Catalan   Total critical care time: 40 minutes  Critical care time was exclusive of separately billable procedures and treating other patients.  Critical care was necessary to treat or prevent imminent or life-threatening deterioration.  Critical care was time spent personally by me on the following activities: development of treatment plan with patient and/or surrogate as well as nursing, discussions with consultants, evaluation of patient's response to treatment, examination of patient, obtaining history from patient or surrogate, ordering and  performing treatments and interventions, ordering and review of laboratory studies, ordering and review of radiographic studies, pulse oximetry, re-evaluation of patient's condition and participation in multidisciplinary rounds.  Lynnell Catalan, MD [facility] ICU Physician [facility]  Pager: [phone] Mobile: [phone] After hours: [phone].

## 2020-06-06 NOTE — Progress Notes (Signed)
Assisted tele visit to patient with son.  [name], RN  

## 2020-06-06 NOTE — Progress Notes (Signed)
Lower extremity venous bilateral study completed.   Please see CV Proc for preliminary results.   Lyric Rossano, RDMS  

## 2020-06-07 DIAGNOSIS — U071 COVID-19: Secondary | ICD-10-CM | POA: Diagnosis not present

## 2020-06-07 LAB — CBC WITH DIFFERENTIAL/PLATELET
Abs Immature Granulocytes: 1.08 10*3/uL — ABNORMAL HIGH (ref 0.00–0.07)
Basophils Absolute: 0.1 10*3/uL (ref 0.0–0.1)
Basophils Relative: 1 %
Eosinophils Absolute: 0 10*3/uL (ref 0.0–0.5)
Eosinophils Relative: 0 %
HCT: 46.9 % (ref 39.0–52.0)
Hemoglobin: 14.9 g/dL (ref 13.0–17.0)
Immature Granulocytes: 6 %
Lymphocytes Relative: 4 %
Lymphs Abs: 0.7 10*3/uL (ref 0.7–4.0)
MCH: 30.5 pg (ref 26.0–34.0)
MCHC: 31.8 g/dL (ref 30.0–36.0)
MCV: 95.9 fL (ref 80.0–100.0)
Monocytes Absolute: 0.9 10*3/uL (ref 0.1–1.0)
Monocytes Relative: 5 %
Neutro Abs: 14.2 10*3/uL — ABNORMAL HIGH (ref 1.7–7.7)
Neutrophils Relative %: 84 %
Platelets: 159 10*3/uL (ref 150–400)
RBC: 4.89 MIL/uL (ref 4.22–5.81)
RDW: 14.5 % (ref 11.5–15.5)
WBC: 17.1 10*3/uL — ABNORMAL HIGH (ref 4.0–10.5)
nRBC: 0.4 % — ABNORMAL HIGH (ref 0.0–0.2)

## 2020-06-07 LAB — PHOSPHORUS: Phosphorus: 4 mg/dL (ref 2.5–4.6)

## 2020-06-07 LAB — COMPREHENSIVE METABOLIC PANEL
ALT: 52 U/L — ABNORMAL HIGH (ref 0–44)
AST: 92 U/L — ABNORMAL HIGH (ref 15–41)
Albumin: 2.6 g/dL — ABNORMAL LOW (ref 3.5–5.0)
Alkaline Phosphatase: 130 U/L — ABNORMAL HIGH (ref 38–126)
Anion gap: 17 — ABNORMAL HIGH (ref 5–15)
BUN: 43 mg/dL — ABNORMAL HIGH (ref 8–23)
CO2: 21 mmol/L — ABNORMAL LOW (ref 22–32)
Calcium: 7.9 mg/dL — ABNORMAL LOW (ref 8.9–10.3)
Chloride: 118 mmol/L — ABNORMAL HIGH (ref 98–111)
Creatinine, Ser: 1.42 mg/dL — ABNORMAL HIGH (ref 0.61–1.24)
GFR, Estimated: 49 mL/min — ABNORMAL LOW (ref >60)
Glucose, Bld: 134 mg/dL — ABNORMAL HIGH (ref 70–99)
Potassium: 4.1 mmol/L (ref 3.5–5.1)
Sodium: 156 mmol/L — ABNORMAL HIGH (ref 135–145)
Total Bilirubin: 1.5 mg/dL — ABNORMAL HIGH (ref 0.3–1.2)
Total Protein: 5.5 g/dL — ABNORMAL LOW (ref 6.5–8.1)

## 2020-06-07 LAB — GLUCOSE, CAPILLARY
Glucose-Capillary: 129 mg/dL — ABNORMAL HIGH (ref 70–99)
Glucose-Capillary: 123 mg/dL — ABNORMAL HIGH (ref 70–99)
Glucose-Capillary: 100 mg/dL — ABNORMAL HIGH (ref 70–99)
Glucose-Capillary: 152 mg/dL — ABNORMAL HIGH (ref 70–99)
Glucose-Capillary: 187 mg/dL — ABNORMAL HIGH (ref 70–99)

## 2020-06-07 LAB — FERRITIN: Ferritin: 826 ng/mL — ABNORMAL HIGH (ref 24–336)

## 2020-06-07 LAB — CULTURE, BLOOD (ROUTINE X 2): Culture: NO GROWTH

## 2020-06-07 LAB — D-DIMER, QUANTITATIVE: D-Dimer, Quant: >20 ug/mL-FEU — ABNORMAL HIGH (ref 0.00–0.50)

## 2020-06-07 LAB — MAGNESIUM: Magnesium: 2.8 mg/dL — ABNORMAL HIGH (ref 1.7–2.4)

## 2020-06-07 LAB — C-REACTIVE PROTEIN: CRP: 4.4 mg/dL — ABNORMAL HIGH (ref <1.0)

## 2020-06-07 MED ORDER — DEXTROSE IN LACTATED RINGERS 5 % IV SOLN: INTRAVENOUS | Status: DC

## 2020-06-07 MED ORDER — DEXMEDETOMIDINE HCL IN NACL 400 MCG/100ML IV SOLN
0.4000 ug/kg/h | INTRAVENOUS | Status: DC
  Administered 2020-06-07: 1.2 ug/kg/h via INTRAVENOUS
  Administered 2020-06-07: 1.8 ug/kg/h via INTRAVENOUS
  Administered 2020-06-07 (×3): 1.2 ug/kg/h via INTRAVENOUS
  Administered 2020-06-07: 1 ug/kg/h via INTRAVENOUS
  Administered 2020-06-08 (×2): 1.4 ug/kg/h via INTRAVENOUS
  Filled 2020-06-07 (×2): qty 100
  Filled 2020-06-07: qty 200
  Filled 2020-06-07 (×4): qty 100

## 2020-06-07 MED ORDER — HYDRALAZINE HCL 20 MG/ML IJ SOLN
5.0000 mg | INTRAMUSCULAR | Status: DC | PRN
  Administered 2020-06-07 (×2): 5 mg via INTRAVENOUS
  Filled 2020-06-07 (×2): qty 1

## 2020-06-07 MED ORDER — MORPHINE SULFATE (PF) 2 MG/ML IV SOLN
2.0000 mg | INTRAVENOUS | Status: DC | PRN
  Administered 2020-06-07 (×2): 2 mg via INTRAVENOUS
  Administered 2020-06-08 (×2): 4 mg via INTRAVENOUS
  Filled 2020-06-07: qty 1
  Filled 2020-06-07: qty 2
  Filled 2020-06-07: qty 1
  Filled 2020-06-07: qty 2

## 2020-06-07 MED ORDER — HYDRALAZINE HCL 20 MG/ML IJ SOLN: 5.0000 mg | INTRAMUSCULAR | Status: DC | PRN

## 2020-06-07 NOTE — Progress Notes (Signed)
Assisted tele visit to patient with family member.  [name] eLink RN  

## 2020-06-07 NOTE — Progress Notes (Signed)
eLink Physician-Brief Progress Note Patient Name: [name] DOB: [birth year] MRN: [id number]   Date of Service  06/07/2020  HPI/Events of Note  Patient with extreme agitation resulting in desaturation.  eICU Interventions  Precedex increased to ceiling of 1.8  Mcg and bilateral soft wrist restraints ordered. Anti-psychotics cannot be used as an adjunct due to his prolonged QTc.        Thomasene Lot Erendira Crabtree 06/07/2020, 10:26 PM

## 2020-06-07 NOTE — Progress Notes (Signed)
elink MD note :  Pt struggling to breathe delerious & pulle dbipap mask off  satn recovered after 5-10 mins on HFNC + NRB  I called son Reita Cliche & told him that his dad is dying, we will not reinstitute bipap & use morphine for comfort as needed Reita Cliche wanted to come vist & since covid status is not clear, I would defer to hospital policy on that Use morphine prn for WOB with goal comfort  Montrelle Eddings V. Vassie Loll MD

## 2020-06-07 NOTE — Progress Notes (Signed)
Pt taken off bipap at this time. Moderate size pressure ulcer noted on bridge of nose. RN at bedside and aware. Mepilex added for protection. Pt placed on HHFNC (40L/100%) along with 15L NRB due to pt being a mouth breather. RT and RN will closely watch pt for bipap needs again

## 2020-06-07 NOTE — Progress Notes (Signed)
NAME:  [name], MRN:  [id number], DOB:  03-09-1936, LOS: 5 ADMISSION DATE:  Jun 30, 2020, CONSULTATION DATE:  10/17 REFERRING MD:  Jacqulyn Bath, CHIEF COMPLAINT:  Dyspnea   Brief History   84 y/o male admitted with COVID pneumonia requiring high levels of O2 support in the ER. Initially progressing towards comfort care however some family members have opted for BiPAP but no intubation.  Past Medical History  Gout  Significant Hospital Events   10/17 admission, later in the evening started on NIMV for worsening hypoxemia  Consults:  None  Procedures:  None  Significant Diagnostic Tests:  Chest x-ray  Micro Data:  SARSCoV2 10/17 >> positive Flu A/B 10/17 >> negative  Antimicrobials:  Remdesivir 10/17 x 1 Azithromycin 10/17 >>  Ceftriaxone 10/17 >>   Interim history/subjective:   Patient became agitated overnight, remained on Precedex drip, became severely bradycardic into 30s.  For short period of time Precedex drip was stopped but he was pulling out his BiPAP and IV line so it had to be restarted and was started on Haldol  Objective   Blood pressure (!) 151/61, pulse 67, temperature (!) 101.8 F (38.8 C), temperature source Axillary, resp. rate (!) 21, height 5\' 10"  (1.778 m), weight 88.5 kg, SpO2 (!) 83 %.    Vent Mode: PCV;BIPAP FiO2 (%):  [60 %-100 %] 100 % Set Rate:  [20 bmp] 20 bmp PEEP:  [8 cmH20] 8 cmH20   Intake/Output Summary (Last 24 hours) at 06/07/2020 1045 Last data filed at 06/07/2020 1000 Gross per 24 hour  Intake 963.33 ml  Output 1660 ml  Net -696.67 ml   Filed Weights   30-Jun-2020 1400 06/03/20 2003  Weight: 75 kg 88.5 kg    Examination:  General:  Elderly male, acutely ill appearing resting in bed on heated high flow and nonrebreather HENT: Atraumatic, normocephalic with dry mucous membranes PULM: Fine crackles at bases bilaterally, no accessory muscle use CV: RRR, no mgr GI: BS+, soft, nontender MSK: normal bulk and tone Neuro: Agitated  not following commands, eyes closed, move all four extremities with full strength.  Resolved Hospital Problem list     Assessment & Plan:   Acute hypoxic respiratory failure due to ARDS from COVID-19 pneumonia Continue BiPAP/heated high flow oxygen and titrate to comfort. Patient's O2 sat ranging between 70s to low 80s Continue dexamethasone Continue empiric ceftriaxone and azithromycin for possible community-acquired pneumonia  Acute encephalopathy hypoxia and hypernatremia.  Currently his major challenge. Continue dexmedetomidine Continue as needed haloperidol we will load and set maintenance dose. Stop morphine  AKI Patient serum creatinine is started getting worse Closely monitor Continue IV fluid  Hypernatremia, likely due to aggressive diuresis and dehydration Started on D5/LR at 75 cc an hour Monitor serum sodium  Elevated LFT > likely remdesivir related Continue to hold remdesivir  Demand ischemia Tele Supportive care  Markedly elevated D-dimer Acute DVT was ruled out  Best practice:  Diet: Core track was failed, will ask family about goals of care discussion Pain/Anxiety/Delirium protocol (if indicated): Precedex VAP protocol (if indicated): n/a DVT prophylaxis: lovenox GI prophylaxis: n/a Glucose control: as above Mobility: bed rest Code Status: DNR/DNI Disposition: to ICU. Family communication: We will update patient's family  Labs   CBC: Recent Labs  Lab 06/03/20 0406 06/03/20 0406 06/03/20 2333 06/04/20 0403 06/05/20 0157 06/06/20 0447 06/07/20 0637  WBC 6.3  --   --  9.7 11.1* 11.3* 17.1*  NEUTROABS 5.1  --   --  8.0* 9.6* 9.4* 14.2*  HGB 13.2   < > 11.2* 11.9* 12.4* 12.5* 14.9  HCT 41.0   < > 33.0* 37.1* 38.6* 39.2 46.9  MCV 93.0  --   --  93.5 93.7 95.8 95.9  PLT 143*  --   --  168 168 165 159   < > = values in this interval not displayed.    Basic Metabolic Panel: Recent Labs  Lab 06/03/20 0406 06/03/20 0406 06/03/20 2333  06/04/20 0403 06/05/20 0157 06/06/20 0447 06/07/20 0637  NA 143   < > 146* 144 148* 153* 156*  K 3.9   < > 3.5 3.9 4.0 4.2 4.1  CL 105  --   --  106 111 118* 118*  CO2 27  --   --  27 27 28  21*  GLUCOSE 167*  --   --  168* 158* 167* 134*  BUN 34*  --   --  45* 56* 55* 43*  CREATININE 1.42*  --   --  1.51* 1.49* 1.36* 1.42*  CALCIUM 8.0*  --   --  8.1* 8.0* 7.8* 7.9*  MG 2.0  --   --  2.4 2.7* 2.7* 2.8*  PHOS 1.6*  --   --  1.9* 3.6 3.2 4.0   < > = values in this interval not displayed.   GFR: Estimated Creatinine Clearance: 44.9 mL/min (A) (by C-G formula based on SCr of 1.42 mg/dL (H)). Recent Labs  Lab Jun 09, 2020 1204 06/09/2020 1422 06/03/20 0406 06/04/20 0403 06/05/20 0157 06/06/20 0447 06/07/20 0637  PROCALCITON 1.39  --   --   --   --   --   --   WBC 6.1  --    < > 9.7 11.1* 11.3* 17.1*  LATICACIDVEN 5.6* 3.2*  --   --   --   --   --    < > = values in this interval not displayed.    Liver Function Tests: Recent Labs  Lab 06/03/20 0406 06/04/20 0403 06/05/20 0157 06/06/20 0447 06/07/20 0637  AST 124* 138* 109* 73* 92*  ALT 37 47* 47* 44 52*  ALKPHOS 126 117 106 104 130*  BILITOT 0.9 0.5 0.6 1.0 1.5*  PROT 5.5* 5.1* 5.1* 4.7* 5.5*  ALBUMIN 2.5* 2.5* 2.3* 2.2* 2.6*   No results for input(s): LIPASE, AMYLASE in the last 168 hours. No results for input(s): AMMONIA in the last 168 hours.  ABG    Component Value Date/Time   PHART 7.431 06/03/2020 2333   PCO2ART 41.8 06/03/2020 2333   PO2ART 38 (LL) 06/03/2020 2333   HCO3 27.8 06/03/2020 2333   TCO2 29 06/03/2020 2333   O2SAT 74.0 06/03/2020 2333     Coagulation Profile: No results for input(s): INR, PROTIME in the last 168 hours.  Cardiac Enzymes: No results for input(s): CKTOTAL, CKMB, CKMBINDEX, TROPONINI in the last 168 hours.  HbA1C: Hgb A1c MFr Bld  Date/Time Value Ref Range Status  06/03/2020 04:06 AM 6.2 (H) 4.8 - 5.6 % Final    Comment:    (NOTE)         Prediabetes: 5.7 - 6.4          Diabetes: >6.4         Glycemic control for adults with diabetes: <7.0     CBG: Recent Labs  Lab 06/06/20 1930 06/06/20 2104 06/06/20 2332 06/07/20 0347 06/07/20 0735  GLUCAP 158* 138* 126* 129* 123*   Total critical care time: 44 minutes  Performed by: 06/09/20   Critical  care time was exclusive of separately billable procedures and treating other patients.   Critical care was necessary to treat or prevent imminent or life-threatening deterioration.   Critical care was time spent personally by me on the following activities: development of treatment plan with patient and/or surrogate as well as nursing, discussions with consultants, evaluation of patient's response to treatment, examination of patient, obtaining history from patient or surrogate, ordering and performing treatments and interventions, ordering and review of laboratory studies, ordering and review of radiographic studies, pulse oximetry and re-evaluation of patient's condition.   Cheri Fowler MD Critical care physician [facility] [place] [facility]  Pager: [phone] Mobile: [phone]

## 2020-06-07 NOTE — Progress Notes (Signed)
eLink Physician-Brief Progress Note Patient Name: Tavoris Brisk DOB: 1935-08-28 MRN: [id number]   Date of Service  06/07/2020  HPI/Events of Note  Patient became extremely agitated of Precedex resulting in safety concerns including disconnection from the ventilator.  eICU Interventions  Precedex infusion re-ordered.        Thomasene Lot Mitsugi Schrader 06/07/2020, 3:51 AM

## 2020-06-07 NOTE — Progress Notes (Signed)
eLink Physician-Brief Progress Note Patient Name: [name] DOB: 1936/06/14 MRN: [id number]   Date of Service  06/07/2020  HPI/Events of Note  Agitation despite 1.2 mcg of Precedex and 4 mg of Haldol .  eICU Interventions  Precedex ceiling increased to 1.8 mcg.        Thomasene Lot Ferdinand Revoir 06/07/2020, 6:08 AM

## 2020-06-07 NOTE — Progress Notes (Addendum)
eLink Physician-Brief Progress Note Patient Name: Cainan Trull DOB: 09/05/1935 MRN: [id number]   Date of Service  06/07/2020  HPI/Events of Note  Heart rate 39 on Precedex. Uncontrolled hypertension.  eICU Interventions  Precedex discontinued. PRN Hydralazine ordered to optimize blood pressure.        Thomasene Lot Dareion Kneece 06/07/2020, 2:42 AM

## 2020-06-08 LAB — GLUCOSE, CAPILLARY
Glucose-Capillary: 133 mg/dL — ABNORMAL HIGH (ref 70–99)
Glucose-Capillary: 144 mg/dL — ABNORMAL HIGH (ref 70–99)
Glucose-Capillary: 150 mg/dL — ABNORMAL HIGH (ref 70–99)
Glucose-Capillary: 373 mg/dL — ABNORMAL HIGH (ref 70–99)

## 2020-06-08 MED ORDER — LORAZEPAM 2 MG/ML IJ SOLN
INTRAMUSCULAR | Status: AC
  Administered 2020-06-08: 2 mg
  Filled 2020-06-08: qty 1

## 2020-06-08 MED ORDER — ACETAMINOPHEN 10 MG/ML IV SOLN
1000.0000 mg | Freq: Once | INTRAVENOUS | Status: AC
  Administered 2020-06-08: 1000 mg via INTRAVENOUS
  Filled 2020-06-08: qty 100

## 2020-06-08 MED ORDER — LORAZEPAM 2 MG/ML IJ SOLN: 2.0000 mg | Freq: Once | INTRAMUSCULAR | Status: AC

## 2020-06-17 NOTE — Progress Notes (Signed)
eLink Physician-Brief Progress Note Patient Name: [name] DOB: 1936/01/07 MRN: [id number]   Date of Service  06/07/2020  HPI/Events of Note  Temperature 101.8, patient too delirious to take PO, and any attempt at a suppository is likely to dramatically worsen his agitation.  eICU Interventions  Tylenol 1 gm iv x 1 ordered.        Thomasene Lot Pietrina Jagodzinski 06/13/2020, 4:14 AM

## 2020-06-17 NOTE — Progress Notes (Signed)
Pt with increased agitation attempting to roll over on belly, requiring 3 RNs to help calm and reposition. While on belly, pt heart stopped briefly and oxygenation decreased. Pt quickly repositioned with SpO2 remaining in the 60's-70's.  Pt son, Reita Cliche called and told pt is in process of actively dying. Son states he is on the way to hospital.

## 2020-06-17 NOTE — Progress Notes (Signed)
Patient's time of death was at 1023. This RN and RT present at time of death. No heart sounds auscultated for a full minute by 2 RN's. Dr. Merrily Pew was informed and updated patient's family regarding time of death.

## 2020-06-17 NOTE — Death Summary Note (Signed)
DEATH SUMMARY   Patient Details  Name: [name] MRN: [id number] DOB: 11-27-1935  Admission/Discharge Information   Admit Date:  June 18, 2020  Date of Death:    Time of Death:    Length of Stay: 6  Referring Physician: Patient, No Pcp Per   Reason(s) for Hospitalization  Acute hypoxic respiratory failure due to ARDS from COVID-19 pneumonia Acute encephalopathy due to hypoxia and hyponatremia Acute kidney injury Hypernatremia Acute drug-induced hepatitis due to remdesivir Demand cardiac ischemia  Diagnoses  Preliminary cause of death:  Secondary Diagnoses (including complications and co-morbidities):  Principal Problem:   Acute hypoxemic respiratory failure due to COVID-19 [facility]) Active Problems:   AKI (acute kidney injury) (HCC)   Elevated troponin   Elevated liver enzymes   COVID-19   COVID   Acute respiratory distress syndrome (ARDS) due to COVID-19 virus (HCC)   COVID-19 virus infection   Advanced care planning/counseling discussion   Goals of care, counseling/discussion   Palliative care by specialist   Brief Hospital Course (including significant findings, care, treatment, and services provided and events leading to death)  [name] is a 83 y.o. year old male, unvaccinated man, former smoker (he is unable to tell me how much), history of gout.  He began to experience dyspnea and generalized weakness, coughing, lethargy over the last 2 to 3 days.  Apparently his wife and grandchildren are COVID-19 positive.  On the morning of admission he could barely walk through the house, was cyanotic in appearance.  EMS was called and he was severely desaturated, placed on 1.00 mask and brought to the ED.  In the emergency department was initially tolerating 15 L/min [place].  COVID-19 testing positive, lactic acidosis noted 5.6.  Chest x-ray with diffuse bilateral pulmonary infiltrates consistent with ARDS.  Other evaluation revealed acute renal failure, mild transaminitis.   Inflammatory indices were all elevated including procalcitonin 1.39.  Admitted by [facility] and housing in the ED. He had increased work of breathing, progressive hypoxemia after trying to get to the side of the bed to urinate.  He was changed to high flow nasal cannula, uptitrated to 70 L/min but continued to have tachypnea and desaturations.  He is now being placed on BiPAP.  PCCM called to evaluate.  Patient was admitted to ICU with acute hypoxic respiratory failure from ARDS due to COVID-19 pneumonia, he was started on remdesivir but his LFTs started getting worse so it was stopped he was continued on IV steroid, patient continued to remain hypoxic, patient's family was contacted and they recommend keeping patient DNR and DNI.  He remained agitated and restless was started on IV Precedex infusion to help with agitation but due to hypoxia it did not help.  Patient was also continued on Solu-Medrol and baricitinib.  Patient serum creatinine started trending up and he became hyponatremic due to severe dehydration with poor oral intake, he was started on D5W with LR but his serum creatinine continues to trend up.  06/14/2020 patient lost his pulse and was declared dead at 10:23 a.m.  Patient's wife and son was updated    Pertinent Labs and Studies  Significant Diagnostic Studies DG Chest Portable 1 View  Result Date: 06-18-2020 CLINICAL DATA:  Shortness of breath. Family is positive for COVID-19. EXAM: PORTABLE CHEST 1 VIEW COMPARISON:  None. FINDINGS: Bilateral pulmonary infiltrates are identified. The heart, hila, mediastinum, lungs, and pleura are otherwise unremarkable. IMPRESSION: Bilateral pulmonary infiltrates are identified. Given the history of COVID-19 exposure, the findings could represent COVID-19 pneumonia.  Recommend clinical correlation. Electronically Signed   By: Gerome Sam III M.D   On: 2020-06-23 12:42   VAS [place] LOWER EXTREMITY VENOUS (DVT)  Result Date: 06/06/2020  Lower Venous  DVTStudy Indications: Elevated d-dimer.  Limitations: Patient agitated, altered mental status, unable to remain still during examination. Comparison Study: No prior studies. Performing Technologist: Jean Rosenthal  Examination Guidelines: A complete evaluation includes B-mode imaging, spectral Doppler, color Doppler, and power Doppler as needed of all accessible portions of each vessel. Bilateral testing is considered an integral part of a complete examination. Limited examinations for reoccurring indications may be performed as noted. The reflux portion of the exam is performed with the patient in reverse Trendelenburg.  +---------+---------------+---------+-----------+----------+--------------+  RIGHT     Compressibility Phasicity Spontaneity Properties Thrombus Aging  +---------+---------------+---------+-----------+----------+--------------+  CFV       Full            Yes       Yes                                    +---------+---------------+---------+-----------+----------+--------------+  SFJ       Full                                                             +---------+---------------+---------+-----------+----------+--------------+  FV Prox   Full                                                             +---------+---------------+---------+-----------+----------+--------------+  FV Mid    Full                                                             +---------+---------------+---------+-----------+----------+--------------+  FV Distal Full                                                             +---------+---------------+---------+-----------+----------+--------------+  PFV       Full                                                             +---------+---------------+---------+-----------+----------+--------------+  POP       Full            Yes       Yes                                    +---------+---------------+---------+-----------+----------+--------------+  PTV       Full                                                              +---------+---------------+---------+-----------+----------+--------------+  PERO      Full                                                             +---------+---------------+---------+-----------+----------+--------------+   +---------+---------------+---------+-----------+----------+--------------+  LEFT      Compressibility Phasicity Spontaneity Properties Thrombus Aging  +---------+---------------+---------+-----------+----------+--------------+  CFV       Full            Yes       Yes                                    +---------+---------------+---------+-----------+----------+--------------+  SFJ       Full                                                             +---------+---------------+---------+-----------+----------+--------------+  FV Prox   Full                                                             +---------+---------------+---------+-----------+----------+--------------+  FV Mid    Full                                                             +---------+---------------+---------+-----------+----------+--------------+  FV Distal Full                                                             +---------+---------------+---------+-----------+----------+--------------+  PFV       Full                                                             +---------+---------------+---------+-----------+----------+--------------+  POP       Full            Yes       Yes                                    +---------+---------------+---------+-----------+----------+--------------+  PTV       Full                                                             +---------+---------------+---------+-----------+----------+--------------+  PERO      Full                                                             +---------+---------------+---------+-----------+----------+--------------+     Summary: RIGHT: - There is no evidence of deep vein thrombosis in the  lower extremity.  - No cystic structure found in the popliteal fossa.  LEFT: - There is no evidence of deep vein thrombosis in the lower extremity.  - No cystic structure found in the popliteal fossa.  *See table(s) above for measurements and observations. Electronically signed by Lemar Livings MD on 06/06/2020 at 4:08:19 PM.    Final     Microbiology Recent Results (from the past 240 hour(s))  Respiratory Panel by RT PCR (Flu A&B, Covid) - Nasopharyngeal Swab     Status: Abnormal   Collection Time: 06-03-20 11:55 AM   Specimen: Nasopharyngeal Swab  Result Value Ref Range Status   SARS Coronavirus 2 by RT PCR POSITIVE (A) NEGATIVE Final    Comment: RESULT CALLED TO, READ BACK BY AND VERIFIED WITH: RB SARAH B. [id number] FCP (NOTE) SARS-CoV-2 target nucleic acids are DETECTED.  SARS-CoV-2 RNA is generally detectable in upper respiratory specimens  during the acute phase of infection. Positive results are indicative of the presence of the identified virus, but do not rule out bacterial infection or co-infection with other pathogens not detected by the test. Clinical correlation with patient history and other diagnostic information is necessary to determine patient infection status. The expected result is Negative.  Fact Sheet for Patients:  https://www.moore.com/  Fact Sheet for Healthcare Providers: https://www.young.biz/  This test is not yet approved or cleared by the [place] FDA and  has been authorized for detection and/or diagnosis of SARS-CoV-2 by FDA under an Emergency Use Authorization (EUA).  This EUA will remain in effect (meaning this test can be used) f or the duration of  the COVID-19 declaration under Section 564(b)(1) of the Act, 21 U.S.C. section 360bbb-3(b)(1), unless the authorization is terminated or revoked sooner.      Influenza A by PCR NEGATIVE NEGATIVE Final   Influenza B by PCR NEGATIVE NEGATIVE Final     Comment: (NOTE) The Xpert Xpress SARS-CoV-2/FLU/RSV assay is intended as an aid in  the diagnosis of influenza from Nasopharyngeal swab specimens and  should not be used as a sole basis for treatment. Nasal washings and  aspirates are unacceptable for Xpert Xpress SARS-CoV-2/FLU/RSV  testing.  Fact Sheet for Patients: https://www.moore.com/  Fact Sheet for Healthcare Providers: https://www.young.biz/  This test is not yet approved or cleared by the [place] FDA and  has been authorized for detection and/or diagnosis of SARS-CoV-2 by  FDA under an Emergency Use Authorization (EUA). This EUA will remain  in effect (meaning this test can be used) for the duration of the  Covid-19 declaration under Section 564(b)(1) of the  Act, 21  U.S.C. section 360bbb-3(b)(1), unless the authorization is  terminated or revoked. Performed at [facility], [address] [place], [place] [address]   Blood Culture (routine x 2)     Status: Abnormal   Collection Time: 06/10/2020 12:05 PM   Specimen: BLOOD  Result Value Ref Range Status   Specimen Description BLOOD LEFT ANTECUBITAL  Final   Special Requests   Final    BOTTLES DRAWN AEROBIC AND ANAEROBIC Blood Culture results may not be optimal due to an inadequate volume of blood received in culture bottles   Culture  Setup Time   Final    GRAM POSITIVE RODS ANAEROBIC BOTTLE ONLY CRITICAL RESULT CALLED TO, READ BACK BY AND VERIFIED WITH: PHRMD L SEAY @0148  06/03/20 BY S GEZAHEGN Performed at [facility], [address] [place], [place] [address]    Culture BACILLUS SPECIES NOT ANTHRACIS (A)  Final   Report Status 06/04/2020 FINAL  Final  Blood Culture (routine x 2)     Status: None   Collection Time: 06/10/2020  1:19 PM   Specimen: BLOOD RIGHT FOREARM  Result Value Ref Range Status   Specimen Description BLOOD RIGHT FOREARM  Final   Special Requests   Final    BOTTLES DRAWN AEROBIC ONLY Blood  Culture results may not be optimal due to an inadequate volume of blood received in culture bottles   Culture   Final    NO GROWTH 5 DAYS Performed at [facility], [address] [place], [place] [address]    Report Status 06/07/2020 FINAL  Final  MRSA PCR Screening     Status: None   Collection Time: 06/04/20 11:04 AM   Specimen: Nasopharyngeal  Result Value Ref Range Status   MRSA by PCR NEGATIVE NEGATIVE Final    Comment:        The GeneXpert MRSA Assay (FDA approved for NASAL specimens only), is one component of a comprehensive MRSA colonization surveillance program. It is not intended to diagnose MRSA infection nor to guide or monitor treatment for MRSA infections. Performed at [facility], [address] [place], [place] [address]     Lab Basic Metabolic Panel: Recent Labs  Lab 06/03/20 0406 06/03/20 0406 06/03/20 2333 06/04/20 0403 06/05/20 0157 06/06/20 0447 06/07/20 0637  NA 143   < > 146* 144 148* 153* 156*  K 3.9   < > 3.5 3.9 4.0 4.2 4.1  CL 105  --   --  106 111 118* 118*  CO2 27  --   --  27 27 28  21*  GLUCOSE 167*  --   --  168* 158* 167* 134*  BUN 34*  --   --  45* 56* 55* 43*  CREATININE 1.42*  --   --  1.51* 1.49* 1.36* 1.42*  CALCIUM 8.0*  --   --  8.1* 8.0* 7.8* 7.9*  MG 2.0  --   --  2.4 2.7* 2.7* 2.8*  PHOS 1.6*  --   --  1.9* 3.6 3.2 4.0   < > = values in this interval not displayed.   Liver Function Tests: Recent Labs  Lab 06/03/20 0406 06/04/20 0403 06/05/20 0157 06/06/20 0447 06/07/20 0637  AST 124* 138* 109* 73* 92*  ALT 37 47* 47* 44 52*  ALKPHOS 126 117 106 104 130*  BILITOT 0.9 0.5 0.6 1.0 1.5*  PROT 5.5* 5.1* 5.1* 4.7* 5.5*  ALBUMIN 2.5* 2.5* 2.3* 2.2* 2.6*  No results for input(s): LIPASE, AMYLASE in the last 168 hours. No results for input(s): AMMONIA in the last 168 hours. CBC: Recent Labs  Lab 06/03/20 0406 06/03/20 0406 06/03/20 2333 06/04/20 0403 06/05/20 0157 06/06/20 0447 06/07/20 0637   WBC 6.3  --   --  9.7 11.1* 11.3* 17.1*  NEUTROABS 5.1  --   --  8.0* 9.6* 9.4* 14.2*  HGB 13.2   < > 11.2* 11.9* 12.4* 12.5* 14.9  HCT 41.0   < > 33.0* 37.1* 38.6* 39.2 46.9  MCV 93.0  --   --  93.5 93.7 95.8 95.9  PLT 143*  --   --  168 168 165 159   < > = values in this interval not displayed.   Cardiac Enzymes: No results for input(s): CKTOTAL, CKMB, CKMBINDEX, TROPONINI in the last 168 hours. Sepsis Labs: Recent Labs  Lab 05/24/2020 1204 06/04/2020 1422 06/03/20 0406 06/04/20 0403 06/05/20 0157 06/06/20 0447 06/07/20 0637  PROCALCITON 1.39  --   --   --   --   --   --   WBC 6.1  --    < > 9.7 11.1* 11.3* 17.1*  LATICACIDVEN 5.6* 3.2*  --   --   --   --   --    < > = values in this interval not displayed.    Procedures/Operations     [employer] 07/04/2020, 10:57 AM

## 2020-06-17 DEATH — deceased

## 2021-06-26 IMAGING — DX DG CHEST 1V PORT
1 series · 1 of 1 positions shown · non-contrast
Comparison: None.

CLINICAL DATA: Shortness of breath. Family is positive for
9VIGR-0C.

EXAM:
PORTABLE CHEST 1 VIEW

[chest ap]
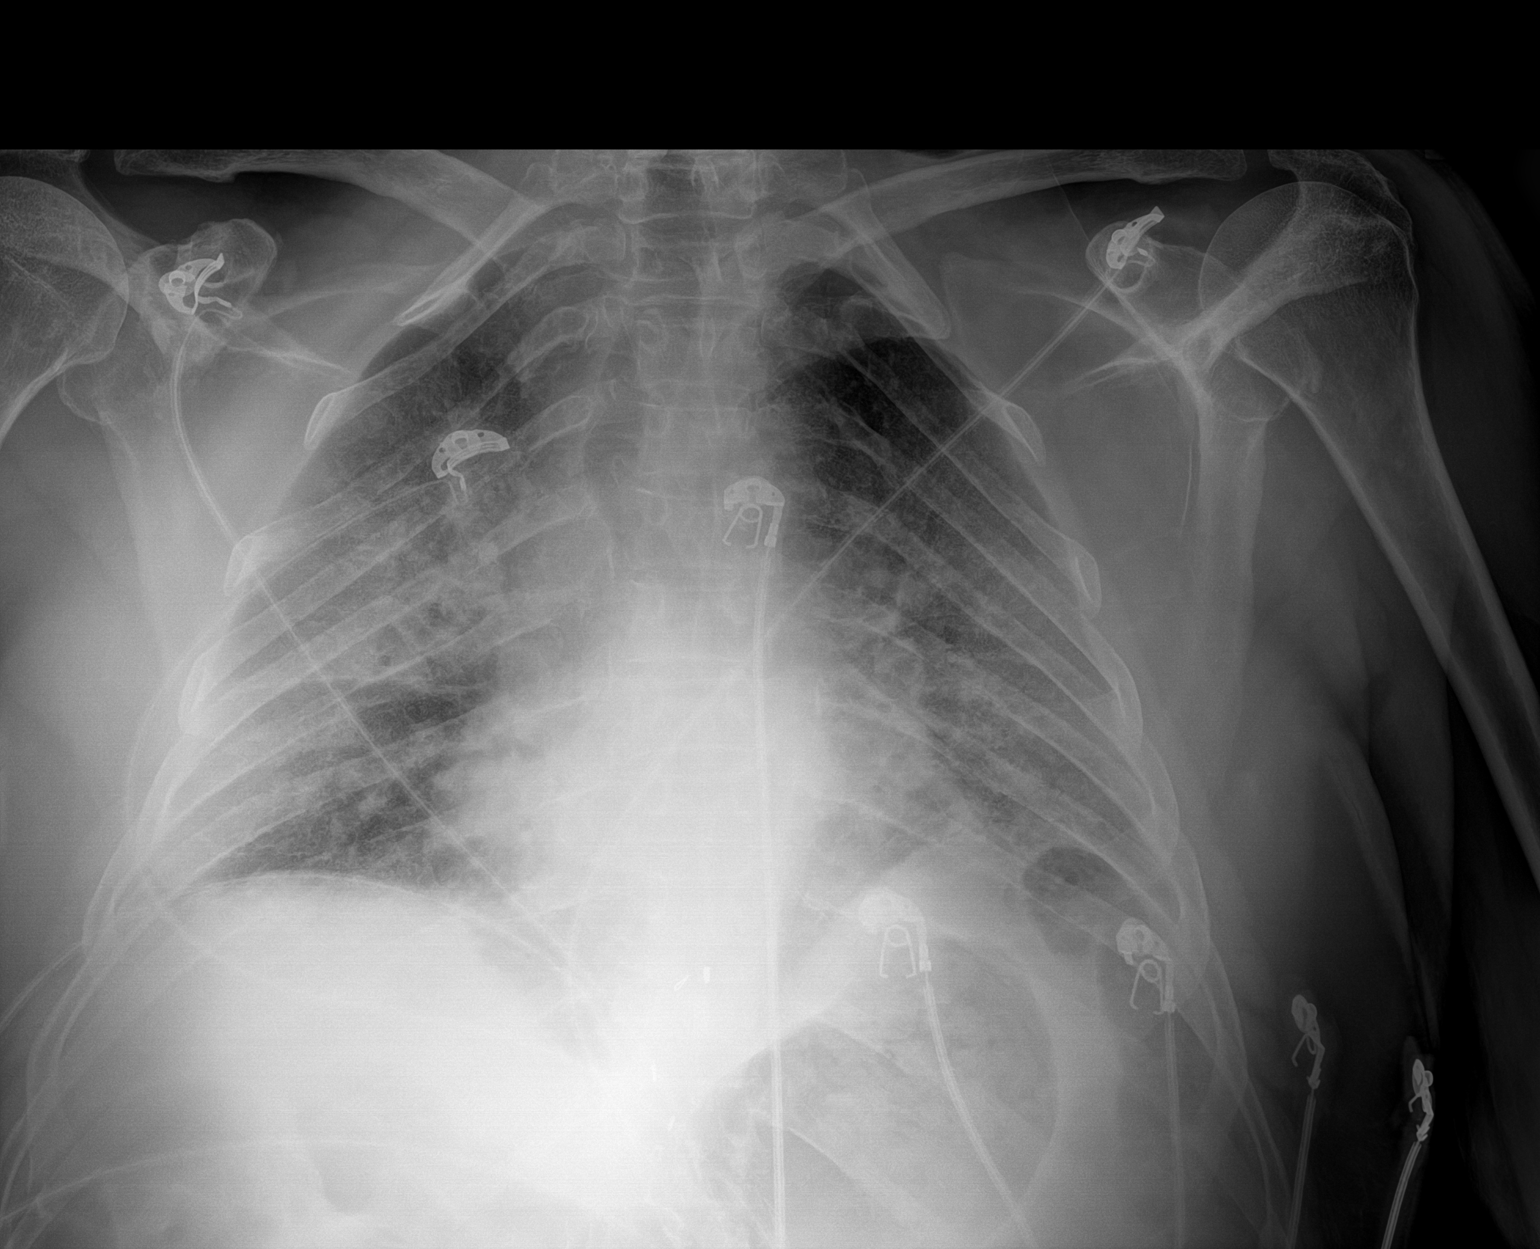

[1 of 1 positions shown; findings below may reference images not displayed]

FINDINGS: Bilateral pulmonary infiltrates are identified. The heart, hila,
mediastinum, lungs, and pleura are otherwise unremarkable.
IMPRESSION: Bilateral pulmonary infiltrates are identified. Given the history of
9VIGR-0C exposure, the findings could represent 9VIGR-0C pneumonia.
Recommend clinical correlation.
